# Patient Record
Sex: Female | Born: 1965 | Race: White | Hispanic: No | Marital: Married | Smoking: Never smoker
Health system: Southern US, Community
[De-identification: ages and names within clinical notes are randomized; demographics above are authoritative.]

## PROBLEM LIST (undated history)

## (undated) DIAGNOSIS — R87619 Unspecified abnormal cytological findings in specimens from cervix uteri: Secondary | ICD-10-CM

## (undated) DIAGNOSIS — F32A Depression, unspecified: Secondary | ICD-10-CM

## (undated) DIAGNOSIS — E119 Type 2 diabetes mellitus without complications: Secondary | ICD-10-CM

## (undated) DIAGNOSIS — IMO0002 Reserved for concepts with insufficient information to code with codable children: Secondary | ICD-10-CM

## (undated) DIAGNOSIS — Z87442 Personal history of urinary calculi: Secondary | ICD-10-CM

## (undated) DIAGNOSIS — R1030 Lower abdominal pain, unspecified: Secondary | ICD-10-CM

## (undated) DIAGNOSIS — D219 Benign neoplasm of connective and other soft tissue, unspecified: Secondary | ICD-10-CM

## (undated) DIAGNOSIS — F329 Major depressive disorder, single episode, unspecified: Secondary | ICD-10-CM

## (undated) DIAGNOSIS — F419 Anxiety disorder, unspecified: Secondary | ICD-10-CM

## (undated) DIAGNOSIS — J342 Deviated nasal septum: Secondary | ICD-10-CM

## (undated) DIAGNOSIS — I10 Essential (primary) hypertension: Secondary | ICD-10-CM

## (undated) HISTORY — DX: Anxiety disorder, unspecified: F41.9

## (undated) HISTORY — DX: Major depressive disorder, single episode, unspecified: F32.9

## (undated) HISTORY — DX: Essential (primary) hypertension: I10

## (undated) HISTORY — DX: Benign neoplasm of connective and other soft tissue, unspecified: D21.9

## (undated) HISTORY — DX: Reserved for concepts with insufficient information to code with codable children: IMO0002

## (undated) HISTORY — DX: Unspecified abnormal cytological findings in specimens from cervix uteri: R87.619

## (undated) HISTORY — DX: Lower abdominal pain, unspecified: R10.30

## (undated) HISTORY — DX: Type 2 diabetes mellitus without complications: E11.9

## (undated) HISTORY — DX: Depression, unspecified: F32.A

## (undated) HISTORY — DX: Deviated nasal septum: J34.2

---
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
## 2004-12-08 ENCOUNTER — Encounter: Admission: RE | Admit: 2004-12-08 | Discharge: 2004-12-08 | Payer: Self-pay | Admitting: Internal Medicine

## 2005-04-13 ENCOUNTER — Other Ambulatory Visit: Admission: RE | Admit: 2005-04-13 | Discharge: 2005-04-13 | Payer: Self-pay | Admitting: Obstetrics and Gynecology

## 2007-08-15 ENCOUNTER — Encounter: Admission: RE | Admit: 2007-08-15 | Discharge: 2007-08-15 | Payer: Self-pay | Admitting: Certified Nurse Midwife

## 2008-02-20 ENCOUNTER — Inpatient Hospital Stay (HOSPITAL_COMMUNITY): Admission: AD | Admit: 2008-02-20 | Discharge: 2008-02-20 | Payer: Self-pay | Admitting: Obstetrics and Gynecology

## 2008-02-21 ENCOUNTER — Inpatient Hospital Stay (HOSPITAL_COMMUNITY): Admission: RE | Admit: 2008-02-21 | Discharge: 2008-02-25 | Payer: Self-pay | Admitting: Obstetrics and Gynecology

## 2008-02-29 ENCOUNTER — Inpatient Hospital Stay (HOSPITAL_COMMUNITY): Admission: AD | Admit: 2008-02-29 | Discharge: 2008-02-29 | Payer: Self-pay | Admitting: Obstetrics and Gynecology

## 2009-01-10 ENCOUNTER — Encounter: Admission: RE | Admit: 2009-01-10 | Discharge: 2009-01-10 | Payer: Self-pay | Admitting: Internal Medicine

## 2010-03-06 ENCOUNTER — Encounter: Admission: RE | Admit: 2010-03-06 | Discharge: 2010-03-06 | Payer: Self-pay | Admitting: Internal Medicine

## 2010-07-05 ENCOUNTER — Encounter: Payer: Self-pay | Admitting: Internal Medicine

## 2010-10-27 NOTE — Op Note (Signed)
[name], [name]              ACCOUNT NO.:  [id number]   MEDICAL RECORD NO.:  [id number]          PATIENT TYPE:  INP   LOCATION:  9107                          FACILITY:  [facility]   PHYSICIAN:  Crist Fat. Rivard, M.D. DATE OF BIRTH:  1965-07-20   DATE OF PROCEDURE:  02/21/2008  DATE OF DISCHARGE:                               OPERATIVE REPORT   PREOPERATIVE DIAGNOSES:  1. Intrauterine pregnancy at 38-1/2 weeks.  2. Chronic hypertension.  3. Type 2 diabetes.  4. Breech presentation.   POSTOPERATIVE DIAGNOSES:  1. Intrauterine pregnancy at 38-1/2 weeks.  2. Chronic hypertension.  3. Type 2 diabetes.  4. Breech presentation.  5. Uterine fibroids.   ANESTHESIA:  Spinal, Quillian Quince, MD   PROCEDURE:  Primary low transverse cesarean section.   SURGEON:  Crist Fat. Rivard, MD   ASSISTANT:  Renaldo Reel. Emilee Hero, CNM   ESTIMATED BLOOD LOSS:  500 mL.   PROCEDURE:  After confirming presentation was still breech and after  reviewing the possible risks of this procedure including bleeding,  infection, injury to bowel, bladder, or ureters, informed consent was  obtained.  The patient was taken to OR #1 and given spinal anesthesia  without any complication.  She was placed in the dorsal decubitus  position, pelvis tilted to the left, prepped and draped in a sterile  fashion, and a Foley catheter was inserted in her bladder.   After assessing adequate level of anesthesia, we infiltrated the  suprapubic area with 30 mL of Marcaine 0.25 and performed a Pfannenstiel  incision, which was brought down sharply to the fascia.  The fascia was  incised in a low transverse fashion.  Linea alba was dissected.  Peritoneum was entered sharply.  An Alexis retractor was easily placed.  Visceral peritoneum was then entered in a low transverse fashion  allowing [place] to safely retract bladder by developing a bladder flap.  We  then entered the myometrium in a low transverse fashion, first with  knife then  extended bluntly.  Amniotic fluid was clear.  We assist the  birth of a female infant at 4:22 p.m. in a frank breech presentation.  Mouth and nose were suctioned.  Cord was clamped with 2 Kelly clamps and  sectioned, and the baby was given to Dr. Katrinka Blazing, neonatologist present in  the room.  The placenta was allowed to deliver spontaneously.  At its  complete, the cord had 3 vessels and uterine revision was negative.  Placenta was sent for cord blood donation then to labor and delivery.  We proceeded with closure of the myometrium in 2 layers, first with a  running locked suture of 0 Vicryl then with a Lembert suture of 0 Vicryl  imbricating the first one.  Hemostasis was completed in the right angle  with a figure-of-eight stitch of 0 Vicryl.  Hemostasis was then  completed on peritoneal edges using cauterization.  Both paracolic  gutters were cleaned.  Both tubes and ovaries assessed and normal.  We  noted at this time the presence of 4 fibroids, 2 posterior measuring  approximately 3 cm each and subserosal,  one anterior measuring 5 cm  subserosal, and a 1-cm subserosal lower uterine segment.  We then  irrigated profusely with warm saline and noticed satisfactory  hemostasis.  Under fascia, hemostasis was completed with cautery and the  fascia was closed with 2 running sutures of 1 Vicryl meeting midline.  The incision was irrigated with warm saline.  Hemostasis was completed  with cautery, and a #10 JP drain was left in the incision with a left  counter incision tacked at the skin with 0 silk.  The skin was then  closed with subcuticular suture of 3-0 Monocryl and Steri-Strips.   Instruments and sponge count was complete x2.  Estimated blood loss was  500 mL.  The procedure was very well tolerated by the patient who was  taken to recovery room in a well and stable condition.   SPECIMEN:  Placenta and cord sent to cord blood donation, then labor and  delivery.   Baby boy who was born at  4:22 p.m., received an Apgar of 8 at 1 minute  and 9 at 5 minutes and weighed 6 pounds 2 ounces.      Crist Fat Rivard, M.D.  Electronically Signed     SAR/MEDQ  D:  02/21/2008  T:  02/22/2008  Job:  [id number]

## 2010-10-27 NOTE — H&P (Signed)
[name], [name]              ACCOUNT NO.:  [id number]   MEDICAL RECORD NO.:  [id number]          PATIENT TYPE:  MAT   LOCATION:  MATC                          [facility]ILITY[facility]  [facility]   PHYSICIAN:  Crist Fat. Rivard, M.D. DATE OF BIRTH:  1965-08-26   DATE OF ADMISSION:  02/20/2008  DATE OF DISCHARGE:  02/20/2008                              HISTORY & PHYSICAL   The patient will be admitted on February 21, 2008 for a scheduled  cesarean section.   HISTORY OF PRESENT ILLNESS:  [name] a is a 45 year old gravida 2,  para 0-0-1-0 at 38-1/7 weeks who presents on February 21, 2008 for  scheduled primary cesarean section secondary to breech presentation.  Pregnancy has been remarkable for:  1. Advanced maternal age with amnio decline.  2. Elevated Down syndrome risk of 1 in 147.  3. Type 2 diabetes prior to pregnancy with the patient on insulin      during her pregnancy.  4. Chronic hypertension.  The patient is on Aldomet 500 mg p.o. q.i.d.  5. Breech presentation with desire for Cesarean section.  6. Group B Strep negative.   PRENATAL LABS:  Blood type is A+, Rh antibody negative, VDRL  nonreactive, rubella titer positive, hepatitis B surface antigen  negative, HIV nonreactive, GC and Chlamydia cultures were negative in  February.  Pap was normal in February.  Hemoglobin A1c at her first OB  visit in February was 6.5.  Hemoglobin upon entering the practice was  13, it was within normal limits at 28 weeks, platelet count was 217 at  her first visit.  She declined an amnio.  She did have a first trimester  screen that showed an elevated Down syndrome risk and an elevated AFP  showing an increased risk of open spina bifida.  She had normal GC and  Chlamydia cultures and beta Strep cultures that were negative were  negative done in August.   HISTORY OF PRESENT PREGNANCY:  The patient entered care at approximately  17 weeks.  She declined an amnio.  She did have an elevated Down  syndrome risk of 1 in 147.  She also had an elevated open neural tube  defect risk based on AFP.  Her fasting blood sugar at her first visit  was 119, 2-hour PCs were in the 120s to 130s.  She had been on metformin  prior to pregnancy.  She had been on a sliding scale insulin.  A fetal  echo was scheduled after 18 weeks.  She did have some limitations for  anatomy scan at 18 weeks.  She had a follow-up at 20 weeks showing  normal growth.  Her fasting blood sugars were still  in the 99 to 121  range with 2-hour PCs in the 110 to 167 range. She was offered to  discontinue the metformin and go straight to insulin or to increase her  metformin dose and the patient elected to increase her metformin dose to  500 mg p.o. b.i.d.  She did have a sliding scale; however; she was not  using this.  She was recommended to begin 30  units of NPH and 15 of  regular every morning and 11 units of regular and 11 units of NPH at  bedtime.  Her fetal echocardiogram was normal.  By 22 weeks, her CBGs  were improving on metformin, but she was still taking it t.i.d.  The  recommendation was to take 1500 mg at dinner time.  She was placed on  Aldomet 250 mg p.o. b.i.d. x 22 weeks.  She also had some plantar  fasciitis at that time.  She had to begin insulin at 22 weeks.  By 23  weeks, she was on NPH 14 units at bedtime.  This was began to be  increased to try to get her fasting blood sugar less than or equal to  90.  She had another ultrasound at 24 weeks for growth which was within  normal limits.  Fluid was also normal.  Fasting and 2-hour PCs were  still elevated.  Insulin was elevated to 20 units of NPH at bedtime and  10 units of NPH with breakfast.  By 27 weeks, her NPH had to be  increased to 30 units at bedtime and 20 units in the morning and a  sliding scale was also recommended.  At 28 weeks, she had an ultrasound  showing normal growth at the 75th percentile and normal fluid.  Aldomet  was increased to 5  mg p.o. t.i.d. and her insulins were increased at  bedtime and in the morning. By 30 weeks, she was on 24 units of NPH in  the morning.  NSTs were begun twice weekly.  She had an ultrasound at 31  weeks showing growth at the 3 pound 13 ounce range, normal fluid, BPP  was 8/8, fetus was breech at that time.  Blood pressures were the  140s/90s.  NPH was increased to 26 units in the morning and 36 units at  bedtime.  She at that time was encouraged to more compliant with meals  and her blood pressure medication.  At 33 weeks, insulin was increased  to 40 units at bedtime and 28 units in the morning but to be given at  10:00 a.m. instead of at 8:00 a.m.  At 34 weeks, she still remained  breech.  Again, at 35 weeks, position was reevaluated.  Normal fluid was  note, but the fetus was still breech with an estimated fetal weight of 5  pounds 10 ounces.  Options for management were reviewed by Dr. Pennie Rushing  including observation for spontaneous version, external version, or  scheduled Cesarean section. Subsequent to this visit, the patient did  wish to proceed with scheduling Cesarean.  Her pressures were in the  140s to 150s/90s.  BPP was normal at 36 weeks.  She had a follow-up  ultrasound at 36 weeks with normal BPP and still breech presentation.  By 37 weeks, she had specifically defined she desired a Cesarean  section.  Her growth was at 6 pounds 13 ounces, average for gestational  age.  AFI was normal.  BPP was normal.  There was a bilateral hydrocele  noted.  Her blood pressure was 130/100.  She was still on Aldomet 500  t.i.d.  She was on 28 units of NPH in the morning, 40 units of NPH at  bedtime and she occasionally had use of a regular insulin sliding scale.  Aldomet was increased to q.i.d.  She was also placed on Ambien.  On  February 20, 2008, she was seen in the office for NST. NST was  reactive;  blood pressures, however, was 142/108 and 150/104.  She had taken 1000  mg of Aldomet in  the morning because her blood pressure was elevated.  She then gave a report of her blood pressures being generally in the  150s/100 all weekend.  She denied any other PIH symptoms.  She was sent  to Maternity Admissions Unit for a PIH workup and these findings were  within normal limits.  The patient was offered to have her Cesarean  section performed today or on February 21, 2008 as scheduled and the  patient did elect to wait and proceed with it on February 21, 2008.  PIH  precautions were given to the patient.   OBSTETRICAL HISTORY:  In 1993 she had a termination of pregnancy in the  first trimester.   MEDICAL HISTORY:  She was on birth control pills more than 10 years ago.  She had an abnormal Pap in the past with a colposcopy.  She has had some  fibroids noted on ultrasound prior to pregnancy.  She reports the usual  childhood illnesses.  She is hypertensive on medication, even prior to  pregnancy.  She is a type 2 diabetic on oral medications prior to  pregnancy and had very occasional insulin use prior to pregnancy.   SURGICAL HISTORY:  Includes a deviated septum in 1984.   ALLERGIES:  Te patient has no known medication allergies.   FAMILY HISTORY:  Her maternal grandmother had angina.  Her father had  atrial fibrillation.  Her father and maternal grandmother have  hypertension.  Her paternal aunt has varicosities.  Her father has had a  stroke.  Her paternal grandmother had some type of uterine or ovarian  cancer.  Her maternal grandfather had colon cancer.  The paternal  grandfather committed suicide and had other mental issues.  Her father  is bipolar.  Her mother has depression and anxiety.   GENETIC HISTORY:  Remarkable for the patient's age of 46.  Her brother  also has scoliosis.  The patient also notes that mental health issues  run on both sides of the family.   SOCIAL HISTORY:  The patient is married to the father of baby.  He is  involved and supportive.  His  name is Lula Olszewski.  The patient is  Caucasian.  She denies any religious affiliation.  She is graduate  educated.  She is in advertising.  Her husband has 3 years of college.  She is currently unemployed.  She has been followed by the Physician  [facility].  She denies any alcohol, drug or tobacco  use during this pregnancy.   PHYSICAL EXAM:  Blood pressure today was 142/108 and 150/104 in the  office, it was less in Maternity admissions.  Other vital signs are  stable.  HEENT: Within normal limits.  LUNGS:  Her breath sounds are clear.  HEART:  Regular rate and rhythm without murmur.  BREASTS:  Soft and nontender.  ABDOMEN:  Fundal height is approximately 39 to 40 cm.  Estimated fetal  weight is 8 to 8-1/2 pounds.  Uterine contractions currently are none  per patient report.  PELVIC:  Exam is deferred.  EXTREMITIES:  Deep tendon reflexes are 2+ without clonus.  There is 1+  edema noted in the lower extremities.   The patient states her blood sugars have been within normal limits.  She  is currently on Aldomet 5 mg p.o. t.i.d., NPH insulin 20 units in the  morning  and 40 units at bedtime with an occasional Humulin regular  sliding scale.  Fetal heart rate today was reactive on NST.   IMPRESSION:  1. Intrauterine pregnancy at 88 and 4-1/7 weeks.  2. Breech presentation with desire for Cesarean section.  3. Advanced maternal age with amniocentesis declined but elevated risk      of Down syndrome and open spina bifida on first trimester screen      and alpha-fetoprotein.  4. Type 2 diabetes with the patient on insulin during pregnancy.  5. Chronic hypertension.  The patient is on Aldomet 5 mg p.o. q.i.d.  6. Group B Streptococcus negative.   PLAN:  1. Admit to [facility] per consult with Dr. Silverio Lay as attending physician.  2. Routine physician preoperative orders.  3. Dr. Estanislado Pandy will determine plan for monitoring of blood sugars  and      treatment of blood pressure postsurgery.      Renaldo [name], C.N.M.      Crist Fat Rivard, M.D.  Electronically Signed    VLL/MEDQ  D:  02/20/2008  T:  02/20/2008  Job:  [id number]

## 2010-10-27 NOTE — Discharge Summary (Signed)
[name]              ACCOUNT NO.:  [id number]   MEDICAL RECORD NO.:  [id number]          PATIENT TYPE:  INP   LOCATION:  9107                          [facility]ACILITY:  [facility]   PHYSICIAN:  Crist Fat. Rivard, M.D. DATE OF BIRTH:  [birth year]   DATE OF ADMISSION:  02/21/2008  DATE OF DISCHARGE:  02/25/2008                               DISCHARGE SUMMARY   ADMITTING DIAGNOSES:  1. Intrauterine pregnancy at 38-3/7th weeks.  2. Chronic hypertension.  3. Type 2 diabetes.  4. Breech presentation.   DISCHARGE DIAGNOSES:  1. Intrauterine pregnancy at 38-3/7th weeks.  2. Chronic hypertension.  3. Type 2 diabetes.  4. Breech presentation.  5. Uterine fibroids.   PROCEDURES:  Primary low transverse cesarean section.   HOSPITAL COURSE:  [name] is a 45 year old gravida 2, para 0-0-1-0  at 38-1/7th weeks, who presented on February 21, 2008 for scheduled  primary cesarean section secondary to breech presentation.  Pregnancy  has been remarkable for:  1. Advanced maternal age with amnio declined,elevated Down syndrome      risk of 1: 147.  2. Type 2 diabetes prior to pregnancy with the patient on insulin      during her pregnancy.  3. Chronic hypertension with the patient on Aldomet 500 mg p.o. q.i.d.  4. Breech presentation with desire for cesarean section.  5. Group B strep negative.   The patient was taken to the operating room by Dr. Dois Davenport Rivard where a  primary low transverse cesarean section was performed with delivery of a  viable female weight 6 pounds 2 ounces.  Apgars were 8 and 9.  The patient  tolerated the procedure well and was taken to recovery in good  condition.  Infant was taken to the full-term nursery.  There were 4  subserosal fibroids noted between 1 and 5 cm.  The patient tolerated the  procedure well.  Infant was taken to the full-term nursery.  By postop  day #1, the patient was doing well.  She was up ad lib.  She was breast  feeding.  Her fasting blood  sugar was 68.  She had had a max 2-hour  postprandial 176 at 9:30 p.m.  Hemoglobin on day one was 11.6, white  blood cell count was 10.7, and platelet count was 146, and blood  pressure was 112/72.  Other vital signs were stable.  Her incision was  clean, dry and intact.  She was begun on metformin 500 mg p.o. b.i.d.  Fasting blood sugars were checked.  Blood sugars were checked for  fasting and 2-hour p.c.  Aldomet 500 mg p.o. q.i.d. was continued.  The  rest of hospital stay, the patient did have some elevations of her blood  pressure.  Her blood sugars were within normal limits. She required some  additional p.r.n. labetalol IV and on February 24, 2008, she was placed  on labetalol 400 mg p.o. t.i.d.  She had previously been on labetalol  300 mg p.o. t.i.d. and then at midnight at 8:00 a.m. and then on  February 24, 2008 she has increased the labetalol 400 mg p.o.  at 4:00  p.m., 12 midnight and 8 a.m.  Her physical exam continued to be within  normal limits.  Her blood sugars remained at good control.  By postop  day #4, she was having no headache, visual symptoms, or epigastric pain.  Her blood pressure was 149/86.  She had received 2 doses of IV labetalol  through the night, early morning 9:13 with blood pressures of 168/110  and 173/102.  After that blood pressures were in the 140-150s/93-95.  Other vital signs were stable.  Fasting blood sugar was 84.  Her  physical exam was within normal limits.  Her JP drain was draining a  very small amount of serosanguineous fluid.  Dr. Estanislado Pandy was consulted.  The patient was deemed to receive full benefit of hospital stay and was  discharged home.  JP drain was removed.   DISCHARGE INSTRUCTIONS:  Per [facility] handout.  PIH and  preeclampsia precautions were also reviewed with the patient.   DISCHARGE MEDICATIONS:  1. Labetalol 600 mg p.o. t.i.d.  2. Metformin 500 mg p.o. b.i.d.  3. Motrin 600 mg one p.o. q.6 h. p.r.n. pain.  4.  Percocet 5/325 one to two p.o. q. 2-4 h. p.r.n. pain.   DISCHARGE FOLLOWUP:  Will occur by the [facility] nurse to see the  patient on Tuesday, February 27, 2008.  The patient was then followed  up in 6 weeks [facility] or p.r.n. based upon blood pressure  values.      [name] [name], C.N.M.      Crist Fat Rivard, M.D.  Electronically Signed    VLL/MEDQ  D:  02/25/2008  T:  02/26/2008  Job:  [id number]

## 2011-03-15 LAB — URINE MICROSCOPIC-ADD ON

## 2011-03-15 LAB — URINALYSIS, ROUTINE W REFLEX MICROSCOPIC
Bilirubin Urine: NEGATIVE
Ketones, ur: NEGATIVE
Leukocytes, UA: NEGATIVE
Nitrite: NEGATIVE
Protein, ur: NEGATIVE
Urobilinogen, UA: 0.2

## 2011-03-15 LAB — COMPREHENSIVE METABOLIC PANEL
BUN: 9
CO2: 25
Calcium: 8.6
Chloride: 106
Creatinine, Ser: 0.71
GFR calc Af Amer: >60
Total Protein: 6.2

## 2011-03-15 LAB — CBC
Hemoglobin: 11.9 — ABNORMAL LOW
Platelets: 320
WBC: 7.6

## 2011-03-17 LAB — GLUCOSE, CAPILLARY
Glucose-Capillary: 121 — ABNORMAL HIGH
Glucose-Capillary: 128 — ABNORMAL HIGH
Glucose-Capillary: 134 — ABNORMAL HIGH
Glucose-Capillary: 176 — ABNORMAL HIGH
Glucose-Capillary: 54 — ABNORMAL LOW
Glucose-Capillary: 67 — ABNORMAL LOW
Glucose-Capillary: 68 — ABNORMAL LOW
Glucose-Capillary: 69 — ABNORMAL LOW
Glucose-Capillary: 79
Glucose-Capillary: 80
Glucose-Capillary: 81
Glucose-Capillary: 84
Glucose-Capillary: 98

## 2011-03-17 LAB — COMPREHENSIVE METABOLIC PANEL
Alkaline Phosphatase: 96
Chloride: 105
Creatinine, Ser: 0.58
GFR calc Af Amer: >60
Glucose, Bld: 129 — ABNORMAL HIGH
Total Bilirubin: 0.2 — ABNORMAL LOW
Total Protein: 5.5 — ABNORMAL LOW

## 2011-03-17 LAB — URINALYSIS, ROUTINE W REFLEX MICROSCOPIC
Bilirubin Urine: NEGATIVE
Glucose, UA: NEGATIVE
Hgb urine dipstick: NEGATIVE
Ketones, ur: NEGATIVE
Specific Gravity, Urine: 1.025
Urobilinogen, UA: 0.2

## 2011-03-17 LAB — RPR: RPR Ser Ql: NONREACTIVE

## 2011-03-17 LAB — CBC
HCT: 34.1 — ABNORMAL LOW
HCT: 37.5
Hemoglobin: 12.8
MCV: 92.7
Platelets: 145 — ABNORMAL LOW
Platelets: 146 — ABNORMAL LOW
RDW: 13.8
WBC: 10.7 — ABNORMAL HIGH
WBC: 8.6

## 2011-03-17 LAB — BASIC METABOLIC PANEL
CO2: 20
Calcium: 8.5
Creatinine, Ser: 0.61
GFR calc Af Amer: >60
Potassium: 4.2
Sodium: 132 — ABNORMAL LOW

## 2011-05-25 ENCOUNTER — Other Ambulatory Visit: Payer: Self-pay | Admitting: Internal Medicine

## 2011-05-25 DIAGNOSIS — Z1231 Encounter for screening mammogram for malignant neoplasm of breast: Secondary | ICD-10-CM

## 2011-05-28 ENCOUNTER — Ambulatory Visit
Admission: RE | Admit: 2011-05-28 | Discharge: 2011-05-28 | Disposition: A | Discharge status: Home / Self Care | Payer: BC Managed Care – PPO | Source: Ambulatory Visit | Attending: Internal Medicine | Admitting: Internal Medicine

## 2011-05-28 DIAGNOSIS — Z1231 Encounter for screening mammogram for malignant neoplasm of breast: Secondary | ICD-10-CM

## 2012-01-12 ENCOUNTER — Ambulatory Visit: Payer: Self-pay | Admitting: Obstetrics and Gynecology

## 2012-01-14 ENCOUNTER — Telehealth: Payer: Self-pay

## 2012-01-14 DIAGNOSIS — E119 Type 2 diabetes mellitus without complications: Secondary | ICD-10-CM | POA: Insufficient documentation

## 2012-01-14 DIAGNOSIS — I10 Essential (primary) hypertension: Secondary | ICD-10-CM | POA: Insufficient documentation

## 2012-01-14 DIAGNOSIS — F419 Anxiety disorder, unspecified: Secondary | ICD-10-CM | POA: Insufficient documentation

## 2012-01-14 DIAGNOSIS — IMO0002 Reserved for concepts with insufficient information to code with codable children: Secondary | ICD-10-CM | POA: Insufficient documentation

## 2012-01-14 DIAGNOSIS — R1030 Lower abdominal pain, unspecified: Secondary | ICD-10-CM | POA: Insufficient documentation

## 2012-01-14 DIAGNOSIS — J342 Deviated nasal septum: Secondary | ICD-10-CM | POA: Insufficient documentation

## 2012-01-14 DIAGNOSIS — D219 Benign neoplasm of connective and other soft tissue, unspecified: Secondary | ICD-10-CM | POA: Insufficient documentation

## 2012-01-14 NOTE — Telephone Encounter (Signed)
Encounter created in error

## 2012-01-20 ENCOUNTER — Ambulatory Visit: Payer: BC Managed Care – PPO | Admitting: Obstetrics and Gynecology

## 2012-02-09 ENCOUNTER — Ambulatory Visit (INDEPENDENT_AMBULATORY_CARE_PROVIDER_SITE_OTHER): Payer: BC Managed Care – PPO | Admitting: Obstetrics and Gynecology

## 2012-02-09 ENCOUNTER — Encounter: Payer: Self-pay | Admitting: Obstetrics and Gynecology

## 2012-02-09 VITALS — BP 104/64 | Ht 60.0 in | Wt 185.0 lb

## 2012-02-09 DIAGNOSIS — R109 Unspecified abdominal pain: Secondary | ICD-10-CM

## 2012-02-09 DIAGNOSIS — Z124 Encounter for screening for malignant neoplasm of cervix: Secondary | ICD-10-CM

## 2012-02-09 NOTE — Progress Notes (Signed)
Subjective:  AEX:  Last Pap: 12/30/2009 WNL: ASCUS Regular Periods:yes Contraception: None Husband with Drema Balzarine' Monthly Breast exam:yes Tetanus<44yrs: unsure Nl.Bladder Function:yes Daily BMs:yes Healthy Diet:yes Calcium:no Mammogram:yes Date of Mammogram: 05/2011 Exercise:yes Have often Exercise: 3-4 times weekly Seatbelt: yes Abuse at home: no Stressful work:no Sigmoid-colonoscopy: pt states she had one in college Bone Density: No PCP: Dr. Ludwig Clarks Change in PMH: None Change in [facility]: None    [name] is a 46 y.o. female, G2P0011, who presents for an annual exam.     History   Social History  . Marital Status: Married    Spouse Name: N/A    Number of Children: N/A  . Years of Education: N/A   Social History Main Topics  . Smoking status: Never Smoker   . Smokeless tobacco: Never Used  . Alcohol Use: No  . Drug Use: No  . Sexually Active: Not Currently    Birth Control/ Protection: None   Other Topics Concern  . None   Social History Narrative  . None    Menstrual cycle:   LMP: Patient's last menstrual period was 01/30/2012.           Cycle: monthly for 5-6 days, heavy for 2 days.  Minor cramps, no meds required.  The following portions of the patient's history were reviewed and updated as appropriate: allergies, current medications, past family history, past medical history, past social history, past surgical history and problem list.  Review of Systems Pertinent items are noted in HPI. Breast:Negative for breast lump,nipple discharge or nipple retraction Gastrointestinal:  abdominal pain on left side with examination.  Her  bowel habits consists of chronic diarrhea, somewhat improved with less alcohol intake.  No rectal bleeding Urinary:negative   Objective:    BP 104/64  Ht 5' (1.524 m)  Wt 185 lb (83.915 kg)  BMI 36.13 kg/m2  LMP 01/30/2012    Weight:  Wt Readings from Last 1 Encounters:  02/09/12 185 lb (83.915 kg)   BMI: Body mass index is 36.13 kg/(m^2).  General Appearance: Alert, appropriate appearance for age. No acute distress HEENT: Grossly normal Neck / Thyroid: Supple, no masses, nodes or enlargement Lungs: clear to auscultation bilaterally Back: No CVA tenderness Breast Exam: No masses or nodes.No dimpling, nipple retraction or discharge. Cardiovascular: Regular rate and rhythm. S1, S2, no murmur Gastrointestinal: Soft, non-tender, no masses or organomegaly Pelvic Exam: Vulva and vagina appear normal. Bimanual exam reveals normal uterus and adnexa. Exam limited by anxiety and body habitus Rectovaginal: normal rectal, no masses Lymphatic Exam: Non-palpable nodes in neck, clavicular, axillary, or inguinal regions Skin: no rash or abnormalities Neurologic: Normal gait and speech, no tremor  Psychiatric: Alert and oriented, appropriate affect.   Wet Prep:not applicable Urinalysis:not applicable UPT: Not done   Assessment:    LLQ pain  chronicdiarrhea Family hx of "female cancer"   Plan:    Mammogram due 05/2012 pap smear with HR HPV.  No hx of abnl pap U/S.  If no etiology found for pain, will do abd pelvic CT  With contrast STD screening: declined Contraception:no method.  declined      Dierdre Forth, MD

## 2012-02-14 LAB — PAP IG AND HPV HIGH-RISK

## 2012-02-29 ENCOUNTER — Telehealth: Payer: Self-pay | Admitting: Obstetrics and Gynecology

## 2012-02-29 NOTE — Telephone Encounter (Signed)
TRIAGE/ TST RES °

## 2012-02-29 NOTE — Telephone Encounter (Signed)
Lm on vm to cb per telephone call.  

## 2012-02-29 NOTE — Telephone Encounter (Signed)
Tc from pt per telephone call. Told pt pap smear-wnl and HPV testing-neg. Pt voices understanding.

## 2012-03-14 ENCOUNTER — Other Ambulatory Visit: Payer: BC Managed Care – PPO

## 2012-03-14 ENCOUNTER — Encounter: Payer: BC Managed Care – PPO | Admitting: Obstetrics and Gynecology

## 2012-03-15 ENCOUNTER — Other Ambulatory Visit: Payer: Self-pay | Admitting: Obstetrics and Gynecology

## 2012-03-15 ENCOUNTER — Encounter: Payer: Self-pay | Admitting: Internal Medicine

## 2012-03-15 ENCOUNTER — Ambulatory Visit (INDEPENDENT_AMBULATORY_CARE_PROVIDER_SITE_OTHER): Payer: Medicaid Other

## 2012-03-15 ENCOUNTER — Ambulatory Visit (INDEPENDENT_AMBULATORY_CARE_PROVIDER_SITE_OTHER): Payer: Medicaid Other | Admitting: Obstetrics and Gynecology

## 2012-03-15 ENCOUNTER — Encounter: Payer: Self-pay | Admitting: Obstetrics and Gynecology

## 2012-03-15 VITALS — BP 110/72 | Ht 59.0 in | Wt 184.0 lb

## 2012-03-15 DIAGNOSIS — R109 Unspecified abdominal pain: Secondary | ICD-10-CM

## 2012-03-15 DIAGNOSIS — G8929 Other chronic pain: Secondary | ICD-10-CM

## 2012-03-15 DIAGNOSIS — R1032 Left lower quadrant pain: Secondary | ICD-10-CM

## 2012-03-15 NOTE — Progress Notes (Signed)
F/U LLQ PAIN  Subjective: Pt states that pain is only present during an exam.  She has a long history of functional bowel changes with loose BMs.  She has had a colonoscopy about 20 years ago for rectal bleeding.  Has grandfather with colon cancer.  Objective: BP 110/72  Ht 4\' 11"  (1.499 m)  Wt 184 lb (83.462 kg)  BMI 37.16 kg/m2  LMP 02/25/2012  ULTRASOUND: Uterus: Length: 7.23 cm   Width:  5.22 cm   Height:  4.23 cm  ROV:  Length: 2.74 cm   Width: 1.66 cm   Height: 2.08 cm  LOV:  Length: 2.26 cm   Width: 1.65 cm   Height: 1.25 cm  Endometrium: 1.08 cm   Left ovary:Normal Right ovary:Normal Fibroids:no   CDS fluid:no  Comment: transvaginal and transabdominal images. Urinary bladder - unremarkable. Anteverted uterus. c-section scar is noted.  No uterine masses. Endo thickness is WNL for secretory phase. LMP was 02/25/2012 Both ovaries are WNLs. Patient is tender in LLQ when scanning left adnexa. No adnexal abnormality is seen. No free fluid.   Assessment: No gyn etiology of LLQ pain noted  Recommendation: GI referral  F/u here for aex or prn

## 2012-04-17 ENCOUNTER — Ambulatory Visit: Payer: BC Managed Care – PPO | Admitting: Internal Medicine

## 2012-05-05 ENCOUNTER — Encounter: Payer: Self-pay | Admitting: Internal Medicine

## 2012-05-05 ENCOUNTER — Ambulatory Visit (INDEPENDENT_AMBULATORY_CARE_PROVIDER_SITE_OTHER): Payer: Medicaid Other | Admitting: Internal Medicine

## 2012-05-05 VITALS — BP 112/68 | HR 70 | Ht 60.0 in | Wt 184.0 lb

## 2012-05-05 DIAGNOSIS — R1032 Left lower quadrant pain: Secondary | ICD-10-CM

## 2012-05-05 DIAGNOSIS — R197 Diarrhea, unspecified: Secondary | ICD-10-CM

## 2012-05-05 MED ORDER — MOVIPREP 100 G PO SOLR: ORAL | Status: DC

## 2012-05-05 NOTE — Patient Instructions (Addendum)
You have been scheduled for a colonoscopy with propofol. Please follow written instructions given to you at your visit today.  Please use the free moviprep we have given you today If you use inhalers (even only as needed) or a CPAP machine, please bring them with you on the day of your procedure.   Thank you for choosing me and [facility].  Iva Boop, M.D., [facility]

## 2012-05-05 NOTE — Progress Notes (Signed)
[facility] Subjective:    Patient ID: [name], female    DOB: Nov 15, 1965, 46 y.o.   MRN: [id number]  HPI Pleasant middle-aged woman here because of LLQ pain and loose stools to diarrhea. Relatively chronic issues which seem a bit worse. Significant stressor is that husband suffered axonal neuropathy after a flu vaccine and is disabled - they are waiting for a settlement sum to help financially. However, even before that has had LLQ discomfort and chronic loose stools. Does eat fiber bar every AM and possibly has high fiber diet. Is on metformin but sxs predate. Rare heartburn. GI ROS otherwise negative. No bleeding. Has seen Dr. Pennie Rushing - GYN evaluation negative. Medications, allergies, past medical history, past surgical history, family history and social history are reviewed and updated in the EMR.  Review of Systems + deprssive sxs, fatigue, rash on hands, stress urinary incontinence. All other ROS negative or as per HPI    Objective:   Physical Exam General:  Well-developed, well-nourished and in no acute distress Eyes:  anicteric. ENT:   Mouth and posterior pharynx free of lesions.  Neck:   supple w/o thyromegaly or mass.  Lungs: Clear to auscultation bilaterally. Heart:  S1S2, no rubs, murmurs, gallops. Abdomen:  soft, non-tender, no hepatosplenomegaly, hernia, or mass and BS+.  Rectal: Deferred until colonoscopy Lymph:  no cervical or supraclavicular adenopathy. Extremities:   no edema Skin   no rash. Neuro:  A&O x 3.  Psych:  appropriate mood and  Affect.   Data Reviewed: Pelvic US - ok OB GYN notes   Assessment & Plan:   1. Diarrhea    2. LLQ pain     Could all be IBS but ? IBD. She is 46 and having these problems so colonoscopy reasonable.  1. Schedule colonoscopy with possible random bxs and terminal ileum intubation The risks and benefits as well as alternatives of endoscopic procedure(s) have been discussed and reviewed. All questions  answered. The patient agrees to proceed. Further plans pending #1. A negative exam may be enough here, i.e. eough reassurance.   Cc: Dierdre Forth, MD

## 2012-05-25 ENCOUNTER — Telehealth: Payer: Self-pay | Admitting: Obstetrics and Gynecology

## 2012-05-25 ENCOUNTER — Telehealth: Payer: Self-pay | Admitting: Internal Medicine

## 2012-05-25 NOTE — Telephone Encounter (Signed)
[facility], pt's last mammogram 05/2011 showed fibroglandular densities in breast. Would you rec a regular screening mammo or 3D mammo?

## 2012-05-25 NOTE — Telephone Encounter (Signed)
Either is fine.  It is pt choice.  She does not have dense breasts

## 2012-05-25 NOTE — Telephone Encounter (Signed)
Patient needs a Feb appt.  She will call back once after the first of the year, as that schedule is not out yet.

## 2012-05-25 NOTE — Telephone Encounter (Signed)
Forwarded to CW 

## 2012-05-26 NOTE — Telephone Encounter (Signed)
Lm on vm to cb per telephone call.  

## 2012-05-26 NOTE — Telephone Encounter (Signed)
Tc from pt per [facility] recs. Pt voices understanding.

## 2012-06-20 ENCOUNTER — Encounter: Payer: Medicaid Other | Admitting: Internal Medicine

## 2012-07-07 ENCOUNTER — Other Ambulatory Visit: Payer: Self-pay | Admitting: Obstetrics and Gynecology

## 2012-07-07 DIAGNOSIS — Z1231 Encounter for screening mammogram for malignant neoplasm of breast: Secondary | ICD-10-CM

## 2012-08-03 ENCOUNTER — Ambulatory Visit
Admission: RE | Admit: 2012-08-03 | Discharge: 2012-08-03 | Disposition: A | Discharge status: Home / Self Care | Payer: Medicaid Other | Source: Ambulatory Visit | Attending: Obstetrics and Gynecology | Admitting: Obstetrics and Gynecology

## 2012-08-03 DIAGNOSIS — Z1231 Encounter for screening mammogram for malignant neoplasm of breast: Secondary | ICD-10-CM

## 2013-02-05 ENCOUNTER — Other Ambulatory Visit: Payer: Self-pay | Admitting: *Deleted

## 2013-02-05 NOTE — Telephone Encounter (Signed)
rx request for test strips, denied, patient needs ov

## 2013-08-06 ENCOUNTER — Other Ambulatory Visit: Payer: Self-pay

## 2013-08-06 ENCOUNTER — Other Ambulatory Visit: Payer: Self-pay | Admitting: Internal Medicine

## 2013-08-06 DIAGNOSIS — Z1231 Encounter for screening mammogram for malignant neoplasm of breast: Secondary | ICD-10-CM

## 2013-08-28 ENCOUNTER — Ambulatory Visit
Admission: RE | Admit: 2013-08-28 | Discharge: 2013-08-28 | Disposition: A | Discharge status: Home / Self Care | Payer: Medicaid Other | Source: Ambulatory Visit | Attending: Internal Medicine | Admitting: Internal Medicine

## 2013-08-28 DIAGNOSIS — Z1231 Encounter for screening mammogram for malignant neoplasm of breast: Secondary | ICD-10-CM

## 2014-03-25 ENCOUNTER — Other Ambulatory Visit (HOSPITAL_COMMUNITY): Payer: BC Managed Care – PPO | Attending: Psychiatry | Admitting: Psychiatry

## 2014-03-25 ENCOUNTER — Encounter (HOSPITAL_COMMUNITY): Payer: Self-pay

## 2014-03-25 DIAGNOSIS — F331 Major depressive disorder, recurrent, moderate: Secondary | ICD-10-CM

## 2014-03-25 DIAGNOSIS — E119 Type 2 diabetes mellitus without complications: Secondary | ICD-10-CM | POA: Diagnosis not present

## 2014-03-25 DIAGNOSIS — F419 Anxiety disorder, unspecified: Secondary | ICD-10-CM | POA: Diagnosis present

## 2014-03-25 DIAGNOSIS — I10 Essential (primary) hypertension: Secondary | ICD-10-CM | POA: Insufficient documentation

## 2014-03-25 DIAGNOSIS — G4700 Insomnia, unspecified: Secondary | ICD-10-CM | POA: Insufficient documentation

## 2014-03-25 DIAGNOSIS — F4310 Post-traumatic stress disorder, unspecified: Secondary | ICD-10-CM
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
## 2014-03-25 NOTE — Progress Notes (Signed)
    Daily Group Progress Note  Program: IOP  Group Time: 9:00-10:30  Participation Level: Active  Behavioral Response: Appropriate  Type of Therapy:  Group Therapy  Summary of Progress: Pt. Arrived at the end of the group. Met with case manager and psychiatrist.      Group Time: 10:30-12:00  Participation Level:  Active  Behavioral Response: Appropriate  Type of Therapy: Psycho-education Group  Summary of Progress: Pt. Participated in grief and loss group facilitated by Jeanella Craze.  Nancie Neas, COUNS

## 2014-03-26 ENCOUNTER — Other Ambulatory Visit (HOSPITAL_COMMUNITY): Payer: BC Managed Care – PPO | Admitting: Psychiatry

## 2014-03-26 DIAGNOSIS — F331 Major depressive disorder, recurrent, moderate: Secondary | ICD-10-CM

## 2014-03-26 DIAGNOSIS — F4310 Post-traumatic stress disorder, unspecified: Secondary | ICD-10-CM

## 2014-03-26 DIAGNOSIS — F419 Anxiety disorder, unspecified: Secondary | ICD-10-CM | POA: Diagnosis not present

## 2014-03-27 ENCOUNTER — Other Ambulatory Visit (HOSPITAL_COMMUNITY): Payer: BC Managed Care – PPO | Admitting: Psychiatry

## 2014-03-27 DIAGNOSIS — F331 Major depressive disorder, recurrent, moderate: Secondary | ICD-10-CM

## 2014-03-27 DIAGNOSIS — F419 Anxiety disorder, unspecified: Secondary | ICD-10-CM | POA: Diagnosis not present

## 2014-03-27 DIAGNOSIS — F4310 Post-traumatic stress disorder, unspecified: Secondary | ICD-10-CM

## 2014-03-27 NOTE — Progress Notes (Signed)
    Daily Group Progress Note  Program: IOP  Group Time: 9:00-10:30  Participation Level: Active  Behavioral Response: Appropriate  Type of Therapy:  Group Therapy  Summary of Progress: Pt. Reports that she is doing "ok". Pt. Processing childhood history, excessive caregiving of her parents, desire to develop healthier boundaries in family relationships.      Group Time: 10:30-12:00  Participation Level:  Active  Behavioral Response: Appropriate  Type of Therapy: Group Therapy  Summary of Progress: Pt. Participated in discussion about developing healthy relationship boundaries.   Nancie Neas, COUNS

## 2014-03-28 ENCOUNTER — Other Ambulatory Visit (INDEPENDENT_AMBULATORY_CARE_PROVIDER_SITE_OTHER): Payer: BC Managed Care – PPO | Admitting: Psychiatry

## 2014-03-28 DIAGNOSIS — F4310 Post-traumatic stress disorder, unspecified: Secondary | ICD-10-CM

## 2014-03-28 MED ORDER — ESCITALOPRAM OXALATE 20 MG PO TABS: 20.0000 mg | ORAL_TABLET | Freq: Every day | ORAL | Status: DC

## 2014-03-28 NOTE — Progress Notes (Signed)
    Daily Group Progress Note  Program: IOP  Group Time: 9:00-10:30  Participation Level: Active  Behavioral Response: Appropriate  Type of Therapy:  Group Therapy  Summary of Progress: Pt. Reported that she was doing "ok". Pt. Continuing to work on personal boundaries, fear that she oversteps in social situations. Pt. Discussed history of medicating with a bottle of wine every night.     Group Time: 10:30-12:00  Participation Level:  Active  Behavioral Response: Appropriate  Type of Therapy: Psycho-education Group  Summary of Progress: Pt. Participated in group facilitated by the mental health association.   Nancie Neas, COUNS

## 2014-03-28 NOTE — Progress Notes (Signed)
Patient ID: [name], female   DOB: 1965-12-06, 48 y.o.   MRN: [id number] Patient seen with Dellia Nims, states she suffered from depression since 2006 and has been very busy taking care of the husband working full-time and taking care of the child. Patient continues to have trouble with middle insomnia although the Xanax helps her mood is depressed anxious angry irritable hopeless and helpless. Patient continues to be angry at her parents because she is the caretaker. Self medicates with 5 glasses of wine every day. I discussed the rationale risks benefits options off Lexapro 20 mg every day for her depression and she gave me her informed consent she'll start that today. She'll continue her other medications at the present doses. Discussed discontinuing her alcohol and patient is willing to do. Denies suicidal or homicidal ideation no hallucinations or delusions.

## 2014-03-28 NOTE — Progress Notes (Signed)
    Daily Group Progress Note  Program: IOP  Group Time: 9:00-10:30  Participation Level: Active  Behavioral Response: Appropriate  Type of Therapy:  Group Therapy  Summary of Progress: Pt. Continuing to work on developing healthy boundaries in her family relationships. Pt. Requested feedback from group about developing balance and not appearing overbearing in interpersonal relationships.      Group Time: 10:30-12:00  Participation Level:  Active  Behavioral Response: Appropriate  Type of Therapy: Psycho-education Group  Summary of Progress: Pt. Participated in discussion about developing healthy self-care routine.   Nancie Neas, COUNS

## 2014-03-29 ENCOUNTER — Ambulatory Visit (INDEPENDENT_AMBULATORY_CARE_PROVIDER_SITE_OTHER): Payer: BC Managed Care – PPO | Admitting: Cardiology

## 2014-03-29 ENCOUNTER — Other Ambulatory Visit (HOSPITAL_COMMUNITY): Payer: BC Managed Care – PPO | Admitting: Psychiatry

## 2014-03-29 ENCOUNTER — Encounter: Payer: Self-pay | Admitting: Cardiology

## 2014-03-29 VITALS — BP 120/78 | HR 60 | Ht 60.0 in | Wt 181.8 lb

## 2014-03-29 DIAGNOSIS — F4310 Post-traumatic stress disorder, unspecified: Secondary | ICD-10-CM

## 2014-03-29 DIAGNOSIS — F331 Major depressive disorder, recurrent, moderate: Secondary | ICD-10-CM

## 2014-03-29 DIAGNOSIS — R079 Chest pain, unspecified: Secondary | ICD-10-CM

## 2014-03-29 DIAGNOSIS — F419 Anxiety disorder, unspecified: Secondary | ICD-10-CM | POA: Diagnosis not present

## 2014-03-29 DIAGNOSIS — E119 Type 2 diabetes mellitus without complications: Secondary | ICD-10-CM

## 2014-03-29 DIAGNOSIS — I10 Essential (primary) hypertension: Secondary | ICD-10-CM

## 2014-03-29 NOTE — Patient Instructions (Signed)
Your physician has requested that you have an exercise tolerance test. For further information please visit [email or web address]. Please also follow instruction sheet, as given.  Your physician recommends that you continue on your current medications as directed. Please refer to the Current Medication list given to you today. Your physician recommends that you schedule a follow-up appointment with Dr. Radford Pax as needed.

## 2014-03-29 NOTE — Progress Notes (Signed)
    Daily Group Progress Note  Program: IOP  Group Time: 9:00-10:30  Participation Level: Active  Behavioral Response: Appropriate  Type of Therapy:  Group Therapy  Summary of Progress: Pt. Smiles and laughs appropriately. Pt. Started antidepressants and reports feeling better mood. Pt. Continues to process need for healthier relationship boundaries.      Group Time: 10:30-12:00  Participation Level:  Active  Behavioral Response: Appropriate  Type of Therapy: Psycho-education Group  Summary of Progress: Pt. Participated in discussion about developing motivation.   Nancie Neas, COUNS

## 2014-03-29 NOTE — Progress Notes (Signed)
[name], [name], [name]  [address] Phone: [phone] Fax:  [phone]  Date:  03/29/2014   ID:  [name], DOB 02-Aug-1965, MRN [id number]  PCP:  Pcp Not In System  Cardiologist:  Fransico Him, MD    History of Present Illness: [name] is a 48 y.o. female with a history of anxiety, HTN, type II DM and depression.  She is being seen in [place] recently due to severe anxiety over her husbands illness with Guienne Bare syndrome.  She says that about a month ago she was under a lot of stress and intermittently she would have some chest pain that would last a few seconds and resolve and would come a few times daily.  It was located over her left breast with no radiation.  She denied any associated symptoms of SOB, diaphoresis or nausea.  Her CP was nonexertional.  It has significantly improved in the past few weeks.  She thinks is was due to mental stress.  She does walk and denies any DOE.  She denies any palpitations or dizziness.   Wt Readings from Last 3 Encounters:  03/29/14 181 lb 12.8 oz (82.464 kg)  05/05/12 184 lb (83.462 kg)  03/15/12 184 lb (83.462 kg)     Past Medical History  Diagnosis Date  . Abnormal pap   . Type II diabetes mellitus   . Hypertension   . Anxiety   . Fibroids     h/o  . Deviated septum 1984  . Groin pain     h/o  . Depression     Current Outpatient Prescriptions  Medication Sig Dispense Refill  . losartan (COZAAR) 50 MG tablet Take 50 mg by mouth daily.      . nebivolol (BYSTOLIC) 10 MG tablet Take 10 mg by mouth daily.      Marland Kitchen ALPRAZolam (XANAX) 0.5 MG tablet Take 0.5 mg by mouth at bedtime as needed.      . Amlodipine-Olmesartan (AZOR PO) Take by mouth.      . Canagliflozin (INVOKANA) 300 MG TABS Take by mouth.      . escitalopram (LEXAPRO) 20 MG tablet Take 1 tablet (20 mg total) by mouth daily.  30 tablet  0  . metFORMIN (GLUMETZA) 500 MG (MOD) 24 hr tablet Take 500 mg by mouth 2 (two) times daily at 10 am  and 4 pm.       . pravastatin (PRAVACHOL) 80 MG tablet Take 80 mg by mouth daily.       No current facility-administered medications for this visit.    Allergies:   No Known Allergies  Social History:  The patient  reports that she has never smoked. She has never used smokeless tobacco. She reports that she drinks about 1.8 - 2.4 ounces of alcohol per week. She reports that she does not use illicit drugs.   Family History:  The patient's family history includes Angina in her maternal grandmother; Anxiety disorder in her brother, maternal grandfather, maternal grandmother, and mother; Atrial fibrillation in her father; Bipolar disorder in her father; Colon cancer in her maternal grandfather; Depression in her brother, maternal grandfather, maternal grandmother, and mother; Hypertension in her father and maternal grandmother; Scoliosis in her brother; Stroke in her father.   ROS:  Please see the history of present illness.      All other systems reviewed and negative.   PHYSICAL EXAM: VS:  BP 120/78  Pulse 60  Ht 5' (1.524 m)  Wt  181 lb 12.8 oz (82.464 kg)  BMI 35.51 kg/m2 Well nourished, well developed, in no acute distress HEENT: normal Neck: no JVD Cardiac:  normal S1, S2; RRR; no murmur Lungs:  clear to auscultation bilaterally, no wheezing, rhonchi or rales Abd: soft, nontender, no hepatomegaly Ext: no edema Skin: warm and dry Neuro:  CNs 2-12 intact, no focal abnormalities noted  EKG:     Sinus bradycardia at 57bpm with no ST changes  ASSESSMENT AND PLAN:  1. Atypical Chest pain that is probably related to anxiety and stress but she has CRF including type II DM, obesity and HTN.  Her EKG is nonischemic.  I will get an ETT to assess for ischemia. 2. HTN well controlled.  Continue Bystolic/Azor and Losartan 3. Type II DM - per PCP 4. Obesity 5. Anxiety  Followup with me PRN pending results of study  Signed, Fransico Him, MD [facility] 03/29/2014 2:57 PM

## 2014-04-01 ENCOUNTER — Other Ambulatory Visit (HOSPITAL_COMMUNITY): Payer: BC Managed Care – PPO | Admitting: Psychiatry

## 2014-04-01 DIAGNOSIS — F419 Anxiety disorder, unspecified: Secondary | ICD-10-CM | POA: Diagnosis not present

## 2014-04-01 DIAGNOSIS — F4310 Post-traumatic stress disorder, unspecified: Secondary | ICD-10-CM

## 2014-04-01 NOTE — Progress Notes (Signed)
    Daily Group Progress Note  Program: IOP  Group Time: 9:00-10:30  Participation Level: Active  Behavioral Response: Appropriate  Type of Therapy:  Group Therapy  Summary of Progress: Pt. Reported that she was feeling tired and that her legs felt heavy. Pt. Attributed lethargy to medication.      Group Time: 10:30-12:00  Participation Level:  Active  Behavioral Response: Appropriate  Type of Therapy: Psycho-education Group  Summary of Progress: Pt. participated in discussion about developing forgiveness of self.   Nancie Neas, LPC

## 2014-04-02 ENCOUNTER — Other Ambulatory Visit (HOSPITAL_COMMUNITY): Payer: BC Managed Care – PPO | Admitting: Psychiatry

## 2014-04-02 DIAGNOSIS — F419 Anxiety disorder, unspecified: Secondary | ICD-10-CM | POA: Diagnosis not present

## 2014-04-02 DIAGNOSIS — F331 Major depressive disorder, recurrent, moderate: Secondary | ICD-10-CM

## 2014-04-02 DIAGNOSIS — F4310 Post-traumatic stress disorder, unspecified: Secondary | ICD-10-CM

## 2014-04-02 NOTE — Progress Notes (Signed)
    Daily Group Progress Note  Program: IOP  Group Time: 9:00-10:30  Participation Level: Active  Behavioral Response: Appropriate  Type of Therapy:  Group Therapy  Summary of Progress: Pt. Smiled and laughed appropriately. Pt. Reported that she was feeling more energy than yesterday and attributes to body adjusting to the medication.      Group Time: 10:30-12:00  Participation Level:  Active  Behavioral Response: Appropriate  Type of Therapy: Psycho-education Group  Summary of Progress: Pt. Participated in grief and loss group.   Nancie Neas, LPC

## 2014-04-03 ENCOUNTER — Other Ambulatory Visit (HOSPITAL_COMMUNITY): Payer: BC Managed Care – PPO | Admitting: Psychiatry

## 2014-04-03 DIAGNOSIS — F4310 Post-traumatic stress disorder, unspecified: Secondary | ICD-10-CM

## 2014-04-03 DIAGNOSIS — F331 Major depressive disorder, recurrent, moderate: Secondary | ICD-10-CM

## 2014-04-03 DIAGNOSIS — F419 Anxiety disorder, unspecified: Secondary | ICD-10-CM | POA: Diagnosis not present

## 2014-04-03 NOTE — Progress Notes (Signed)
    Daily Group Progress Note  Program: IOP  Group Time: 9:00-10:30  Participation Level: Active  Behavioral Response: Appropriate  Type of Therapy:  Group Therapy  Summary of Progress: Pt. Shared feelings about developing more rigid boundaries with her mother, developing acceptance of self, and feeling accomplished at this stage in her life.      Group Time: 10:30-12:00  Participation Level:  Active  Behavioral Response: Appropriate  Type of Therapy: Psycho-education Group  Summary of Progress: Pt. Participated in discussion about developing connection and vulnerability.   Nancie Neas, LPC

## 2014-04-04 ENCOUNTER — Encounter: Payer: Self-pay | Admitting: Internal Medicine

## 2014-04-04 ENCOUNTER — Other Ambulatory Visit (HOSPITAL_COMMUNITY): Payer: BC Managed Care – PPO | Admitting: Psychiatry

## 2014-04-04 DIAGNOSIS — F331 Major depressive disorder, recurrent, moderate: Secondary | ICD-10-CM

## 2014-04-04 DIAGNOSIS — F419 Anxiety disorder, unspecified: Secondary | ICD-10-CM | POA: Diagnosis not present

## 2014-04-04 DIAGNOSIS — F4310 Post-traumatic stress disorder, unspecified: Secondary | ICD-10-CM

## 2014-04-04 NOTE — Progress Notes (Signed)
    Daily Group Progress Note  Program: IOP  Group Time: 9:00-10:30  Participation Level: Active  Behavioral Response: Appropriate  Type of Therapy:  Group Therapy  Summary of Progress: Pt. Continues to process acceptance of herself, feeling and understanding her emotions without the use of alcohol to numb.     Group Time: 10:30-12:00  Participation Level:  Active  Behavioral Response: Appropriate  Type of Therapy: Psycho-education Group  Summary of Progress: Pt. Participated in discussion about developing patience and acceptance of self.   Nancie Neas, LPC

## 2014-04-05 ENCOUNTER — Other Ambulatory Visit (HOSPITAL_COMMUNITY): Payer: BC Managed Care – PPO

## 2014-04-05 ENCOUNTER — Telehealth (HOSPITAL_COMMUNITY): Payer: Self-pay | Admitting: Psychiatry

## 2014-04-08 ENCOUNTER — Other Ambulatory Visit (HOSPITAL_COMMUNITY): Payer: BC Managed Care – PPO

## 2014-04-09 ENCOUNTER — Other Ambulatory Visit (HOSPITAL_COMMUNITY): Payer: BC Managed Care – PPO | Admitting: Psychiatry

## 2014-04-09 DIAGNOSIS — F331 Major depressive disorder, recurrent, moderate: Secondary | ICD-10-CM

## 2014-04-09 DIAGNOSIS — F419 Anxiety disorder, unspecified: Secondary | ICD-10-CM | POA: Diagnosis not present

## 2014-04-09 DIAGNOSIS — F4310 Post-traumatic stress disorder, unspecified: Secondary | ICD-10-CM

## 2014-04-10 ENCOUNTER — Other Ambulatory Visit (HOSPITAL_COMMUNITY): Payer: BC Managed Care – PPO | Admitting: Psychiatry

## 2014-04-10 ENCOUNTER — Other Ambulatory Visit (HOSPITAL_COMMUNITY): Payer: BC Managed Care – PPO

## 2014-04-10 DIAGNOSIS — F419 Anxiety disorder, unspecified: Secondary | ICD-10-CM | POA: Diagnosis not present

## 2014-04-10 DIAGNOSIS — F4310 Post-traumatic stress disorder, unspecified: Secondary | ICD-10-CM

## 2014-04-10 DIAGNOSIS — F331 Major depressive disorder, recurrent, moderate: Secondary | ICD-10-CM

## 2014-04-10 NOTE — Progress Notes (Signed)
    Daily Group Progress Note  Program: IOP  Group Time: 9:00-10:30  Participation Level: Active  Behavioral Response: Appropriate  Type of Therapy:  Group Therapy  Summary of Progress: Pt. Reported that she was feeling "ok". Pt. Continues to process guilt that she feels for having depression and feeling that she is not able to be fully present for her son right now.      Group Time: 10:30-12:00  Participation Level:  Active  Behavioral Response: Appropriate  Type of Therapy: Psycho-education Group  Summary of Progress: Pt. Participated in discussion about developing self-acceptance.   Nancie Neas, LPC

## 2014-04-11 ENCOUNTER — Other Ambulatory Visit (HOSPITAL_COMMUNITY): Payer: BC Managed Care – PPO | Admitting: Psychiatry

## 2014-04-11 DIAGNOSIS — F4310 Post-traumatic stress disorder, unspecified: Secondary | ICD-10-CM

## 2014-04-11 DIAGNOSIS — F419 Anxiety disorder, unspecified: Secondary | ICD-10-CM | POA: Diagnosis not present

## 2014-04-11 DIAGNOSIS — F331 Major depressive disorder, recurrent, moderate: Secondary | ICD-10-CM

## 2014-04-11 NOTE — Progress Notes (Signed)
    Daily Group Progress Note  Program: IOP  Group Time: 9:00-10:30  Participation Level: Active  Behavioral Response: Appropriate  Type of Therapy:  Group Therapy  Summary of Progress: Pt. Was alert, responsive to feedback from others. Pt. Discussed challenge of setting healthy boundaries with her adult brother.      Group Time: 10:30-12:00  Participation Level:  Active  Behavioral Response: Appropriate  Type of Therapy: Psycho-education Group  Summary of Progress: Pt. Participated in guided meditation and breathwork exercises.   Nancie Neas, LPC

## 2014-04-12 ENCOUNTER — Other Ambulatory Visit (HOSPITAL_COMMUNITY): Payer: BC Managed Care – PPO | Admitting: Psychiatry

## 2014-04-12 DIAGNOSIS — F419 Anxiety disorder, unspecified: Secondary | ICD-10-CM | POA: Diagnosis not present

## 2014-04-12 DIAGNOSIS — F4310 Post-traumatic stress disorder, unspecified: Secondary | ICD-10-CM

## 2014-04-12 NOTE — Progress Notes (Signed)
    Daily Group Progress Note  Program: IOP  Group Time: 9:00-10:30  Participation Level: Active  Behavioral Response: Appropriate  Type of Therapy:  Group Therapy  Summary of Progress: Pt. Smiled and laughed appropriately. Pt. Presented as calm, relaxed. Pt. Continuing to process fear and surrendering control of husband's health.      Group Time: 10:30-12:00  Participation Level:  Active  Behavioral Response: Appropriate  Type of Therapy: Group Therapy  Summary of Progress: Pt. Processed feelings of loss of control and surrender.  Nancie Neas, LPC

## 2014-04-12 NOTE — Progress Notes (Signed)
    Daily Group Progress Note  Program: IOP  Group Time: 9:00-10:30  Participation Level: Active  Behavioral Response: Appropriate  Type of Therapy:  Group Therapy  Summary of Progress: Pt. Was talkative, and responsive to feedback from the group. Pt. Discussed need for healthier boundaries with her adult brother.      Group Time: 10:30-12:00  Participation Level:  Active  Behavioral Response: Appropriate  Type of Therapy: Psycho-education Group  Summary of Progress: Pt. Participated in guided meditation exercise.   Nancie Neas, LPC

## 2014-04-15 ENCOUNTER — Encounter: Payer: Self-pay | Admitting: Cardiology

## 2014-04-15 ENCOUNTER — Other Ambulatory Visit (HOSPITAL_COMMUNITY): Payer: BC Managed Care – PPO

## 2014-04-15 ENCOUNTER — Other Ambulatory Visit (HOSPITAL_COMMUNITY): Payer: BC Managed Care – PPO | Attending: Psychiatry | Admitting: Psychiatry

## 2014-04-15 DIAGNOSIS — F331 Major depressive disorder, recurrent, moderate: Secondary | ICD-10-CM

## 2014-04-15 DIAGNOSIS — E118 Type 2 diabetes mellitus with unspecified complications: Secondary | ICD-10-CM | POA: Insufficient documentation

## 2014-04-15 DIAGNOSIS — F329 Major depressive disorder, single episode, unspecified: Secondary | ICD-10-CM | POA: Diagnosis not present

## 2014-04-15 DIAGNOSIS — I10 Essential (primary) hypertension: Secondary | ICD-10-CM | POA: Insufficient documentation

## 2014-04-15 DIAGNOSIS — F411 Generalized anxiety disorder: Secondary | ICD-10-CM | POA: Diagnosis present

## 2014-04-15 DIAGNOSIS — F4310 Post-traumatic stress disorder, unspecified: Secondary | ICD-10-CM

## 2014-04-16 ENCOUNTER — Other Ambulatory Visit (HOSPITAL_COMMUNITY): Payer: BC Managed Care – PPO | Admitting: Psychiatry

## 2014-04-16 DIAGNOSIS — F331 Major depressive disorder, recurrent, moderate: Secondary | ICD-10-CM

## 2014-04-16 DIAGNOSIS — F4310 Post-traumatic stress disorder, unspecified: Secondary | ICD-10-CM

## 2014-04-16 DIAGNOSIS — F411 Generalized anxiety disorder: Secondary | ICD-10-CM | POA: Diagnosis not present

## 2014-04-16 NOTE — Progress Notes (Signed)
    Daily Group Progress Note  Program: IOP  Group Time: 9:00-10:30  Participation Level: Active  Behavioral Response: Appropriate  Type of Therapy:  Group Therapy  Summary of Progress: Pt. Presents as talkative, receptive to feedback, provides feedback to others. Pt. Smiles and laughs appropriately. Pt. Processed loss of relationship with husband, anger and guilt feelings related to husband's disability.      Group Time: 10:30-12:00  Participation Level:  Active  Behavioral Response: Appropriate  Type of Therapy: Psycho-education Group  Summary of Progress: Pt. Participated in grief and loss facilitated by Jeanella Craze.   Nancie Neas, LPC

## 2014-04-17 ENCOUNTER — Other Ambulatory Visit (HOSPITAL_COMMUNITY): Payer: BC Managed Care – PPO | Admitting: Psychiatry

## 2014-04-17 DIAGNOSIS — F411 Generalized anxiety disorder: Secondary | ICD-10-CM | POA: Diagnosis not present

## 2014-04-17 NOTE — Progress Notes (Signed)
    Daily Group Progress Note  Program: IOP  Group Time: 9:00-10:30  Participation Level: Active  Behavioral Response: Appropriate  Type of Therapy:  Group Therapy  Summary of Progress: Pt. Smiled and laughed appropriately. Pt. Reported that she is being more intentional about slowing down in her daily life, rushing less, and developing patience. Pt. Reported that she recognizes that anger was a primary coping behavior and that her son has noticed that she is less angry.     Group Time: 10:30-12:00  Participation Level:  Active  Behavioral Response: Appropriate  Type of Therapy: Psycho-education Group  Summary of Progress: Pt. Participated in discussion about developing vulnerability as resistance to shame.   Nancie Neas, LPC

## 2014-04-18 ENCOUNTER — Other Ambulatory Visit (HOSPITAL_COMMUNITY): Payer: BC Managed Care – PPO | Admitting: Psychiatry

## 2014-04-18 DIAGNOSIS — F411 Generalized anxiety disorder: Secondary | ICD-10-CM | POA: Diagnosis not present

## 2014-04-18 NOTE — Progress Notes (Signed)
    Daily Group Progress Note  Program: IOP  Group Time: 9:00-10:30  Participation Level: Active  Behavioral Response: Appropriate  Type of Therapy:  Group Therapy  Summary of Progress: Pt. Reports readiness for discharge from group as evidenced by a reduction in her anger and commitment to developing patience and slowing down in her life. Pt. Processed resistance to expressing gratitude as core beliefs of unworthiness.     Group Time: 10:30-12:00  Participation Level:  Active  Behavioral Response: Appropriate  Type of Therapy: Psycho-education Group  Summary of Progress: Pt. Participated in guided visualization with magical color shower meditation.   Nancie Neas, LPC

## 2014-04-19 ENCOUNTER — Other Ambulatory Visit (HOSPITAL_COMMUNITY): Payer: BC Managed Care – PPO | Admitting: Psychiatry

## 2014-04-19 DIAGNOSIS — F411 Generalized anxiety disorder: Secondary | ICD-10-CM | POA: Diagnosis not present

## 2014-04-19 NOTE — Progress Notes (Signed)
    Daily Group Progress Note  Program: IOP  Group Time: 9:00-10:30  Participation Level: Active  Behavioral Response: Appropriate  Type of Therapy:  Group Therapy  Summary of Progress: pt. Continues to actively participate in group, discussed progress with reduction of anger and anxiety. Pt. Reports desire for motivation and energy.     Group Time: 10:30-12:00  Participation Level:  Active  Behavioral Response: Appropriate  Type of Therapy: Psycho-education Group  Summary of Progress: Pt. Participated in discussion about developing self-care practice.   Nancie Neas, LPC

## 2014-04-19 NOTE — Patient Instructions (Signed)
Pt will complete MH-IOP on 04-22-14.  Will follow up with Dr. Doyne Keel on 05-07-14 @ 10 a.m.Marland Kitchen  Provided pt with Hospice phone number for individual counseling.  Pt plans to meet with Bonnita Nasuti (intern) for a few sessions.  Encouraged support groups.

## 2014-04-19 NOTE — Progress Notes (Signed)
    Daily Group Progress Note  Program: IOP  Group Time: 9:00-10:30  Participation Level: Active  Behavioral Response: Appropriate  Type of Therapy:  Group Therapy  Summary of Progress: Pt. Decided to discharge on Monday. Pt. Continues to report improvement in her mood and development of self-care behaviors.     Group Time: 10:30-12:00  Participation Level:  Active  Behavioral Response: Appropriate  Type of Therapy: Psycho-education Group  Summary of Progress: Pt. Participated in discussion about developing self-compassion. Pt. Watched and discussed the [employer] video.   Nancie Neas, LPC

## 2014-04-22 ENCOUNTER — Other Ambulatory Visit (HOSPITAL_COMMUNITY): Payer: BC Managed Care – PPO | Admitting: Psychiatry

## 2014-04-22 DIAGNOSIS — F331 Major depressive disorder, recurrent, moderate: Secondary | ICD-10-CM

## 2014-04-23 ENCOUNTER — Telehealth: Payer: Self-pay | Admitting: Cardiology

## 2014-04-23 ENCOUNTER — Other Ambulatory Visit (HOSPITAL_COMMUNITY): Payer: BC Managed Care – PPO

## 2014-04-23 NOTE — Progress Notes (Signed)
[name] is a 48 y.o. ,unemployed, married, caucasian female, who was a self referral to [facility]. Admitted to worsening anxiety and depressive symptoms. Other symptoms included: Poor sleep (awakenings), ruminating, irritable, isolation, fluctuating energy, anhedonia, indecisive, no motivation, low self-esteem, and crying spells. Stated symptoms worsened ~ five yrs ago today. Triggers/Stressors: 1) Husband's Illness: On 03-25-09, husband of thirteen years marriage was diagnosed with GB syndrome, in which he contracted from a flu shot. He was left paralyzed. He was on a respiratory for six months. Stayed in the hospital for ten months. Is at home now and has a full-time caregiver who comes into the home. 2) Finances: Currently receiving a monthly payout as long as husband is alive. Pt hasn't worked since January 2013. Pt was fired due to tardiness at [employer]. 3) Conflictual relationship with mother-in-law. States she doesn't trust her, due to conflict while she was visiting them. Also, reports conflict between she and her mother. 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 "She verbally abuses my father who was recently diagnosed with Alzheimer's." According to pt, he had a stroke in October 1991. 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 Pt has hx of being non-compliant with medications. States she forgot to take her medicine this morning. Pt denied any inpt psychiatric visits. Hx of seeing various therapists, no psychiatrists on an outpt basis. Admitted to one previous suicide attempt, which was an overdose in college.  Pt completed MH-IOP yesterday.  Reported decreased anger, low energy, poor appetite (eats two meals daily), and only drinking 1-2 glasses of wine at night.  States she is applying boundaries.  Denies SI/HI or A/V hallucinations.  Reports that the groups were helpful.  Planning to attend a play on Saturday.  A:  D/C today.  F/U with Dr.  Doyne Keel on 05-07-14 @ 10 a.m.  Encouraged support groups.  Pt will call Hospice for individual counseling.  R:  Pt receptive.

## 2014-04-23 NOTE — Telephone Encounter (Signed)
New problem   Pt stated she is calling to sched a stress test but there was no order in system. Please advise pt.

## 2014-04-23 NOTE — Progress Notes (Signed)
  [facility] Discharge Summary  [name] [id number]  Admission date: 03/25/2014 Discharge date: 04/23/2014  Reason for admission: depression and anxiety  Chemical Use History: none  Family of Origin Issues: none  Progress in Program Toward Treatment Goals: says anxiety and depression are greatly decreased and that she was able to "let some things out " in group that was helpful  Progress (rationale): medications seemed to have helped as well as the group support and addressing some issues she had bottled up  Lexapro was decreased to 10 mg daily as the 20 mg made her feel jumpy/a little bit wired.She will follow up with Dr Robet Leu and a Hospice therapist.  Clarene Reamer, MD 11/10/201510

## 2014-04-23 NOTE — Telephone Encounter (Signed)
To Dr. Turner for review and recommendations. 

## 2014-04-24 ENCOUNTER — Other Ambulatory Visit (HOSPITAL_COMMUNITY): Payer: BC Managed Care – PPO

## 2014-04-24 ENCOUNTER — Encounter (HOSPITAL_COMMUNITY): Payer: Self-pay | Admitting: Psychiatry

## 2014-04-25 ENCOUNTER — Other Ambulatory Visit (HOSPITAL_COMMUNITY): Payer: BC Managed Care – PPO

## 2014-04-26 ENCOUNTER — Other Ambulatory Visit (HOSPITAL_COMMUNITY): Payer: BC Managed Care – PPO

## 2014-04-26 ENCOUNTER — Other Ambulatory Visit: Payer: Self-pay

## 2014-04-26 DIAGNOSIS — R079 Chest pain, unspecified: Secondary | ICD-10-CM

## 2014-04-26 NOTE — Telephone Encounter (Signed)
GXT ordered. To [facility] for scheduling.

## 2014-04-29 ENCOUNTER — Other Ambulatory Visit (HOSPITAL_COMMUNITY): Payer: BC Managed Care – PPO

## 2014-04-30 ENCOUNTER — Other Ambulatory Visit (HOSPITAL_COMMUNITY): Payer: BC Managed Care – PPO

## 2014-05-01 ENCOUNTER — Other Ambulatory Visit (HOSPITAL_COMMUNITY): Payer: BC Managed Care – PPO

## 2014-05-02 ENCOUNTER — Other Ambulatory Visit (HOSPITAL_COMMUNITY): Payer: BC Managed Care – PPO

## 2014-05-03 ENCOUNTER — Other Ambulatory Visit (HOSPITAL_COMMUNITY): Payer: BC Managed Care – PPO

## 2014-05-07 ENCOUNTER — Ambulatory Visit (INDEPENDENT_AMBULATORY_CARE_PROVIDER_SITE_OTHER): Payer: BC Managed Care – PPO | Admitting: Psychiatry

## 2014-05-07 ENCOUNTER — Encounter (HOSPITAL_COMMUNITY): Payer: Self-pay | Admitting: Psychiatry

## 2014-05-07 VITALS — BP 126/82 | HR 56 | Ht 60.0 in | Wt 180.2 lb

## 2014-05-07 DIAGNOSIS — F411 Generalized anxiety disorder: Secondary | ICD-10-CM

## 2014-05-07 MED ORDER — ESCITALOPRAM OXALATE 10 MG PO TABS: 10.0000 mg | ORAL_TABLET | Freq: Every day | ORAL | Status: DC
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
## 2014-05-23 ENCOUNTER — Telehealth (HOSPITAL_COMMUNITY): Payer: Self-pay

## 2014-06-04 ENCOUNTER — Encounter: Payer: Self-pay | Admitting: Physician Assistant

## 2014-06-04 ENCOUNTER — Telehealth (HOSPITAL_COMMUNITY): Payer: Self-pay

## 2014-06-04 NOTE — Telephone Encounter (Signed)
Encounter complete. 

## 2014-06-11 ENCOUNTER — Telehealth (HOSPITAL_COMMUNITY): Payer: Self-pay

## 2014-06-11 ENCOUNTER — Ambulatory Visit (HOSPITAL_COMMUNITY)
Admission: RE | Admit: 2014-06-11 | Discharge: 2014-06-11 | Disposition: A | Discharge status: Home / Self Care | Payer: BC Managed Care – PPO | Source: Ambulatory Visit | Attending: Physician Assistant | Admitting: Physician Assistant

## 2014-06-11 DIAGNOSIS — R0602 Shortness of breath: Secondary | ICD-10-CM | POA: Insufficient documentation

## 2014-06-11 DIAGNOSIS — R5383 Other fatigue: Secondary | ICD-10-CM | POA: Insufficient documentation

## 2014-06-11 DIAGNOSIS — I209 Angina pectoris, unspecified: Secondary | ICD-10-CM

## 2014-06-11 DIAGNOSIS — R079 Chest pain, unspecified: Secondary | ICD-10-CM | POA: Diagnosis present

## 2014-06-11 NOTE — Procedures (Signed)
Exercise Treadmill Test  Pre-Exercise Testing Evaluation Rhythm: normal sinus                  Test  Exercise Tolerance Test Ordering MD: Fransico Him, MD    Unique Test No: 1  Treadmill:  1  Indication for ETT: chest pain - rule out ischemia  Contraindication to ETT: No   Stress Modality: exercise - treadmill  Cardiac Imaging Performed: non   Protocol: standard Bruce - maximal  Max BP:  152/92  Max MPHR (bpm):  171 85% MPR (bpm):  145  MPHR obtained (bpm):  129 % MPHR obtained:  75  Reached 85% MPHR (min:sec):  0 Total Exercise Time (min-sec):  7  Workload in METS:  8.6 Borg Scale: 16  Reason ETT Terminated:  SOB and Fatigue, Pt. stated she had to stop    ST Segment Analysis At Rest: normal ST segments - no evidence of significant ST depression With Exercise: no evidence of significant ST depression  Other Information Arrhythmia:  No Angina during ETT:  absent (0) Quality of ETT:  non-diagnostic  ETT Interpretation:  normal - no evidence of ischemia by ST analysis  Comments: Non diagnostic because of inability to reach target HR  Recommendations: F/U with Dr. Radford Pax

## 2014-06-11 NOTE — Telephone Encounter (Signed)
Encounter complete. 

## 2014-06-13 ENCOUNTER — Telehealth: Payer: Self-pay | Admitting: Cardiology

## 2014-06-13 DIAGNOSIS — R079 Chest pain, unspecified: Secondary | ICD-10-CM

## 2014-06-13 NOTE — Telephone Encounter (Signed)
-----   Message from Sueanne Margarita, MD sent at 06/12/2014  6:47 PM EST ----- Please let patient know that stress test was fine but she did not reach her target HR so nondiagnostic study - please order a stress echo

## 2014-06-13 NOTE — Telephone Encounter (Signed)
New message ° ° ° ° °Returning Katy's call °

## 2014-06-13 NOTE — Telephone Encounter (Signed)
Patient informed of results and verbal understanding expressed.  Stress ECHO ordered for scheduling.

## 2014-06-13 NOTE — Telephone Encounter (Signed)
Left message to call back  

## 2014-06-13 NOTE — Telephone Encounter (Signed)
Follow up      Returning [name]'s call to get test results

## 2014-06-18 ENCOUNTER — Encounter: Payer: Self-pay | Admitting: Physician Assistant

## 2014-06-26 ENCOUNTER — Telehealth: Payer: Self-pay | Admitting: Cardiology

## 2014-06-26 ENCOUNTER — Other Ambulatory Visit (HOSPITAL_COMMUNITY): Payer: BLUE CROSS/BLUE SHIELD

## 2014-06-26 ENCOUNTER — Telehealth: Payer: Self-pay

## 2014-06-26 DIAGNOSIS — R0789 Other chest pain: Secondary | ICD-10-CM

## 2014-06-26 NOTE — Telephone Encounter (Signed)
Per Dr. Radford Pax, cancelled stress ECHO and ordered lexiscan myoview for scheduling.  Patient notified.

## 2014-06-26 NOTE — Telephone Encounter (Signed)
New problem   Pt returning your call concerning  test.

## 2014-06-26 NOTE — Telephone Encounter (Signed)
Explained to patient Lexiscan instructions and informed her she will be called to schedule. Patient thankful for callback.

## 2014-07-09 ENCOUNTER — Ambulatory Visit (HOSPITAL_COMMUNITY): Payer: Self-pay | Admitting: Psychiatry

## 2014-07-16 ENCOUNTER — Ambulatory Visit (INDEPENDENT_AMBULATORY_CARE_PROVIDER_SITE_OTHER): Payer: BLUE CROSS/BLUE SHIELD | Admitting: Psychiatry

## 2014-07-16 ENCOUNTER — Encounter (HOSPITAL_COMMUNITY): Payer: Self-pay | Admitting: Psychiatry

## 2014-07-16 VITALS — BP 121/61 | HR 68 | Ht 60.0 in | Wt 185.8 lb

## 2014-07-16 DIAGNOSIS — F411 Generalized anxiety disorder: Secondary | ICD-10-CM
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
## 2014-07-17 ENCOUNTER — Ambulatory Visit (HOSPITAL_COMMUNITY): Payer: BLUE CROSS/BLUE SHIELD | Attending: Internal Medicine | Admitting: Radiology

## 2014-07-17 DIAGNOSIS — R0789 Other chest pain: Secondary | ICD-10-CM | POA: Diagnosis not present

## 2014-07-17 MED ORDER — TECHNETIUM TC 99M SESTAMIBI GENERIC - CARDIOLITE
33.0000 | Freq: Once | INTRAVENOUS | Status: AC | PRN
  Administered 2014-07-17: 33 via INTRAVENOUS

## 2014-07-17 MED ORDER — TECHNETIUM TC 99M SESTAMIBI GENERIC - CARDIOLITE
11.0000 | Freq: Once | INTRAVENOUS | Status: AC | PRN
  Administered 2014-07-17: 11 via INTRAVENOUS

## 2014-07-17 MED ORDER — REGADENOSON 0.4 MG/5ML IV SOLN
0.4000 mg | Freq: Once | INTRAVENOUS | Status: AC
  Administered 2014-07-17: 0.4 mg via INTRAVENOUS

## 2014-07-17 NOTE — Progress Notes (Signed)
[facility] [address] [place], [place] [address] [phone]    Cardiology Nuclear Med Study  [name] is a 49 y.o. female     MRN : [id number]     DOB: Nov 05, 1965  Procedure Date: 07/17/2014  Nuclear Med Background Indication for Stress Test:  Evaluation for Ischemia and 12/15 Non-diagnostic GXT History:  No known CAD Cardiac Risk Factors: Hypertension and NIDDM  Symptoms:  Chest Pain   Nuclear Pre-Procedure Caffeine/Decaff Intake:  None NPO After: 8:00am   Lungs:  clear O2 Sat: 98% on room air. IV 0.9% NS with Angio Cath:  22g  IV Site: R Hand  IV Started by:  Crissie Figures, RN  Chest Size (in):  42 Cup Size: B  Height: 5' (1.524 m)  Weight:  182 lb (82.555 kg)  BMI:  Body mass index is 35.54 kg/(m^2). Tech Comments:  Bystolic held x 24 hrs    Nuclear Med Study 1 or 2 day study: 1 day  Stress Test Type:  Treadmill/Lexiscan  Reading MD: N/A  Order Authorizing Provider:  Fransico Him, MD  Resting Radionuclide: Technetium 57m Sestamibi  Resting Radionuclide Dose: 11.0 mCi   Stress Radionuclide:  Technetium 32m Sestamibi  Stress Radionuclide Dose: 33.0 mCi           Stress Protocol Rest HR: 57 Stress HR: 98  Rest BP: 117/84 Stress BP: 142/98  Exercise Time (min): n/a METS: n/a   Predicted Max HR: 171 bpm % Max HR: 57.31 bpm Rate Pressure Product: 14896   Dose of Adenosine (mg):  n/a Dose of Lexiscan: 0.4 mg  Dose of Atropine (mg): n/a Dose of Dobutamine: n/a mcg/kg/min (at max HR)  Stress Test Technologist: Glade Lloyd, BS-ES  Nuclear Technologist:  Earl Many, CNMT     Rest Procedure:  Myocardial perfusion imaging was performed at rest 45 minutes following the intravenous administration of Technetium 36m Sestamibi. Rest ECG: NSR - Normal EKG  Stress Procedure:  The patient received IV Lexiscan 0.4 mg over 15-seconds with concurrent low level exercise and then Technetium 31m Sestamibi was injected at 30-seconds while the  patient continued walking one more minute.  Quantitative spect images were obtained after a 45-minute delay.  During the infusion of Lexiscan the patient complained of SOB and feeling flushed.  These symptoms resolved in recovery.  Stress ECG: No significant change from baseline ECG  QPS Raw Data Images:  Normal; no motion artifact; normal heart/lung ratio. Stress Images:  Small, mild apical perfusion defect. Rest Images:  Small, mild apical perfusion defect. Subtraction (SDS):  Fixed, small mild apical perfusion defect. Transient Ischemic Dilatation (Normal <1.22):  0.92 Lung/Heart Ratio (Normal <0.45):  0.23  Quantitative Gated Spect Images QGS EDV:  80 ml QGS ESV:  31 ml  Impression Exercise Capacity:  Lexiscan with low level exercise. BP Response:  Normal blood pressure response. Clinical Symptoms:  Short of breath ECG Impression:  No significant ST segment change suggestive of ischemia. Comparison with Prior Nuclear Study: No images to compare  Overall Impression:  Low risk stress nuclear study with a small, mild fixed apical perfusion defect.  This most likely represents attenuation given normal apical wall motion. .  LV Ejection Fraction: 62%.  LV Wall Motion:  NL LV Function; NL Wall Motion\  [name] 07/17/2014

## 2014-07-18 ENCOUNTER — Other Ambulatory Visit: Payer: Self-pay

## 2014-07-18 DIAGNOSIS — Z1231 Encounter for screening mammogram for malignant neoplasm of breast: Secondary | ICD-10-CM

## 2014-07-19 ENCOUNTER — Telehealth: Payer: Self-pay | Admitting: *Deleted

## 2014-07-19 DIAGNOSIS — R079 Chest pain, unspecified: Secondary | ICD-10-CM

## 2014-07-19 NOTE — Telephone Encounter (Signed)
Patient notified of stress test results. Patient verbalized understanding and agreement with current treatment plan.

## 2014-07-23 ENCOUNTER — Encounter: Payer: Self-pay | Admitting: Cardiology

## 2014-07-23 NOTE — Telephone Encounter (Signed)
New Msg        Pt calling, states she was informed to call the office today but is unsure why.    Please return pt call.

## 2014-07-23 NOTE — Telephone Encounter (Signed)
Pt would like  to talk with [facility]. Pt will call tomorrow when [facility] when is  here.

## 2014-07-24 NOTE — Telephone Encounter (Signed)
This encounter was created in error - please disregard.

## 2014-07-25 NOTE — Telephone Encounter (Signed)
Instructed patient to check ECHO (per nuclear stress test results). Patient agrees with treatment plan.  ECHO ordered for scheduling.

## 2014-07-25 NOTE — Telephone Encounter (Signed)
Follow up  ° ° °Patient returning call back to nurse  °

## 2014-08-07 ENCOUNTER — Ambulatory Visit (HOSPITAL_COMMUNITY): Payer: BLUE CROSS/BLUE SHIELD | Attending: Cardiovascular Disease | Admitting: Cardiology

## 2014-08-07 DIAGNOSIS — E669 Obesity, unspecified: Secondary | ICD-10-CM | POA: Diagnosis not present

## 2014-08-07 DIAGNOSIS — R079 Chest pain, unspecified: Secondary | ICD-10-CM | POA: Diagnosis not present

## 2014-08-07 DIAGNOSIS — I10 Essential (primary) hypertension: Secondary | ICD-10-CM | POA: Diagnosis not present

## 2014-08-07 DIAGNOSIS — E119 Type 2 diabetes mellitus without complications: Secondary | ICD-10-CM | POA: Diagnosis not present

## 2014-08-07 NOTE — Progress Notes (Signed)
Echo performed. 

## 2014-08-08 ENCOUNTER — Telehealth: Payer: Self-pay | Admitting: Cardiology

## 2014-08-08 NOTE — Telephone Encounter (Signed)
New message ° ° ° ° ° °Want echo results °

## 2014-08-08 NOTE — Telephone Encounter (Signed)
Pt returned call but hung up before operator could transfer. Attempted to call pt back 3 times but it goes directly to voicemail. Left message for pt to call back.

## 2014-08-08 NOTE — Telephone Encounter (Signed)
Left pt a message to call back. 

## 2014-08-08 NOTE — Telephone Encounter (Signed)
Pt returned call and I informed pt of echo results.  Pt verbalized understanding.

## 2014-08-15 ENCOUNTER — Encounter (HOSPITAL_COMMUNITY): Payer: Self-pay

## 2014-09-04 ENCOUNTER — Ambulatory Visit: Payer: Self-pay

## 2014-09-17 ENCOUNTER — Ambulatory Visit
Admission: RE | Admit: 2014-09-17 | Discharge: 2014-09-17 | Disposition: A | Discharge status: Home / Self Care | Payer: BLUE CROSS/BLUE SHIELD | Source: Ambulatory Visit

## 2014-09-17 DIAGNOSIS — Z1231 Encounter for screening mammogram for malignant neoplasm of breast: Secondary | ICD-10-CM

## 2014-10-17 ENCOUNTER — Ambulatory Visit (HOSPITAL_COMMUNITY): Payer: Self-pay | Admitting: Psychiatry

## 2014-10-22 ENCOUNTER — Ambulatory Visit (HOSPITAL_COMMUNITY): Payer: Self-pay | Admitting: Psychiatry

## 2015-05-26 ENCOUNTER — Other Ambulatory Visit: Payer: Self-pay

## 2015-05-26 DIAGNOSIS — Z1231 Encounter for screening mammogram for malignant neoplasm of breast: Secondary | ICD-10-CM

## 2015-05-27 ENCOUNTER — Encounter: Payer: Self-pay | Admitting: Internal Medicine

## 2015-05-27 NOTE — Telephone Encounter (Signed)
A user error has taken place.

## 2015-06-18 ENCOUNTER — Emergency Department (HOSPITAL_COMMUNITY): Payer: Medicaid Other

## 2015-06-18 ENCOUNTER — Emergency Department (HOSPITAL_COMMUNITY)
Admission: EM | Admit: 2015-06-18 | Discharge: 2015-06-18 | Disposition: A | Discharge status: Home / Self Care | Payer: Medicaid Other | Attending: Physician Assistant | Admitting: Physician Assistant

## 2015-06-18 ENCOUNTER — Encounter (HOSPITAL_COMMUNITY): Payer: Self-pay | Admitting: *Deleted

## 2015-06-18 DIAGNOSIS — R451 Restlessness and agitation: Secondary | ICD-10-CM | POA: Insufficient documentation

## 2015-06-18 DIAGNOSIS — F329 Major depressive disorder, single episode, unspecified: Secondary | ICD-10-CM | POA: Insufficient documentation

## 2015-06-18 DIAGNOSIS — N200 Calculus of kidney: Secondary | ICD-10-CM | POA: Diagnosis not present

## 2015-06-18 DIAGNOSIS — F419 Anxiety disorder, unspecified: Secondary | ICD-10-CM | POA: Diagnosis not present

## 2015-06-18 DIAGNOSIS — Z79899 Other long term (current) drug therapy: Secondary | ICD-10-CM | POA: Insufficient documentation

## 2015-06-18 DIAGNOSIS — Z86018 Personal history of other benign neoplasm: Secondary | ICD-10-CM | POA: Insufficient documentation

## 2015-06-18 DIAGNOSIS — R109 Unspecified abdominal pain: Secondary | ICD-10-CM | POA: Diagnosis present

## 2015-06-18 DIAGNOSIS — I10 Essential (primary) hypertension: Secondary | ICD-10-CM | POA: Insufficient documentation

## 2015-06-18 DIAGNOSIS — E119 Type 2 diabetes mellitus without complications: Secondary | ICD-10-CM | POA: Diagnosis not present

## 2015-06-18 DIAGNOSIS — Z3202 Encounter for pregnancy test, result negative: Secondary | ICD-10-CM | POA: Insufficient documentation

## 2015-06-18 LAB — CBC WITH DIFFERENTIAL/PLATELET
Basophils Absolute: 0 10*3/uL (ref 0.0–0.1)
Basophils Relative: 0 %
Eosinophils Absolute: 0.1 10*3/uL (ref 0.0–0.7)
Eosinophils Relative: 2 %
HEMATOCRIT: 38.9 % (ref 36.0–46.0)
HEMOGLOBIN: 13.3 g/dL (ref 12.0–15.0)
LYMPHS ABS: 1.5 10*3/uL (ref 0.7–4.0)
Lymphocytes Relative: 22 %
MCH: 30.8 pg (ref 26.0–34.0)
MCHC: 34.2 g/dL (ref 30.0–36.0)
MCV: 90 fL (ref 78.0–100.0)
MONOS PCT: 4 %
Monocytes Absolute: 0.3 10*3/uL (ref 0.1–1.0)
NEUTROS ABS: 5 10*3/uL (ref 1.7–7.7)
NEUTROS PCT: 72 %
Platelets: 174 10*3/uL (ref 150–400)
RBC: 4.32 MIL/uL (ref 3.87–5.11)
RDW: 12.2 % (ref 11.5–15.5)
WBC: 6.9 10*3/uL (ref 4.0–10.5)

## 2015-06-18 LAB — COMPREHENSIVE METABOLIC PANEL
ALBUMIN: 3.7 g/dL (ref 3.5–5.0)
ALK PHOS: 74 U/L (ref 38–126)
ALT: 20 U/L (ref 14–54)
ANION GAP: 11 (ref 5–15)
AST: 18 U/L (ref 15–41)
BILIRUBIN TOTAL: 1.1 mg/dL (ref 0.3–1.2)
BUN: 12 mg/dL (ref 6–20)
CALCIUM: 9 mg/dL (ref 8.9–10.3)
CO2: 22 mmol/L (ref 22–32)
CREATININE: 0.76 mg/dL (ref 0.44–1.00)
Chloride: 99 mmol/L — ABNORMAL LOW (ref 101–111)
GFR calc non Af Amer: >60 mL/min (ref >60)
GLUCOSE: 311 mg/dL — AB (ref 65–99)
Potassium: 3.8 mmol/L (ref 3.5–5.1)
SODIUM: 132 mmol/L — AB (ref 135–145)
TOTAL PROTEIN: 7.6 g/dL (ref 6.5–8.1)

## 2015-06-18 LAB — URINE MICROSCOPIC-ADD ON: RBC / HPF: NONE SEEN RBC/hpf (ref 0–5)

## 2015-06-18 LAB — URINALYSIS, ROUTINE W REFLEX MICROSCOPIC
BILIRUBIN URINE: NEGATIVE
Hgb urine dipstick: NEGATIVE
KETONES UR: 15 mg/dL — AB
LEUKOCYTES UA: NEGATIVE
Nitrite: NEGATIVE
PH: 5 (ref 5.0–8.0)
PROTEIN: NEGATIVE mg/dL
Specific Gravity, Urine: 1.023 (ref 1.005–1.030)

## 2015-06-18 LAB — POC URINE PREG, ED: Preg Test, Ur: NEGATIVE

## 2015-06-18 LAB — LIPASE, BLOOD: Lipase: 30 U/L (ref 11–51)

## 2015-06-18 MED ORDER — TAMSULOSIN HCL 0.4 MG PO CAPS: 0.4000 mg | ORAL_CAPSULE | Freq: Every day | ORAL | Status: DC

## 2015-06-18 MED ORDER — MORPHINE SULFATE (PF) 4 MG/ML IV SOLN
4.0000 mg | Freq: Once | INTRAVENOUS | Status: AC
  Administered 2015-06-18: 4 mg via INTRAVENOUS
  Filled 2015-06-18: qty 1

## 2015-06-18 MED ORDER — ONDANSETRON HCL 4 MG/2ML IJ SOLN
4.0000 mg | Freq: Once | INTRAMUSCULAR | Status: AC
  Administered 2015-06-18: 4 mg via INTRAVENOUS
  Filled 2015-06-18: qty 2

## 2015-06-18 MED ORDER — KETOROLAC TROMETHAMINE 30 MG/ML IJ SOLN
30.0000 mg | Freq: Once | INTRAMUSCULAR | Status: AC
  Administered 2015-06-18: 30 mg via INTRAVENOUS
  Filled 2015-06-18: qty 1

## 2015-06-18 MED ORDER — IBUPROFEN 800 MG PO TABS: 800.0000 mg | ORAL_TABLET | Freq: Three times a day (TID) | ORAL | Status: DC

## 2015-06-18 MED ORDER — SODIUM CHLORIDE 0.9 % IV BOLUS (SEPSIS)
1000.0000 mL | Freq: Once | INTRAVENOUS | Status: AC
  Administered 2015-06-18: 1000 mL via INTRAVENOUS

## 2015-06-18 MED ORDER — ONDANSETRON HCL 4 MG PO TABS: 4.0000 mg | ORAL_TABLET | Freq: Three times a day (TID) | ORAL | Status: DC | PRN

## 2015-06-18 MED ORDER — OXYCODONE-ACETAMINOPHEN 5-325 MG PO TABS: 1.0000 | ORAL_TABLET | Freq: Four times a day (QID) | ORAL | Status: DC | PRN

## 2015-06-18 NOTE — ED Notes (Signed)
Pt reports left side flank pain sudden onset this am, denies pain with urination. Pt very anxious at triage.

## 2015-06-18 NOTE — Discharge Instructions (Signed)
Please return with fever or if pain gets worse or lasts more than 2 days. You may also need to follow up with urology (number given) for kidney stones.   Dietary Guidelines to Help Prevent Kidney Stones Your risk of kidney stones can be decreased by adjusting the foods you eat. The most important thing you can do is drink enough fluid. You should drink enough fluid to keep your urine clear or pale yellow. The following guidelines provide specific information for the type of kidney stone you have had. GUIDELINES ACCORDING TO TYPE OF KIDNEY STONE Calcium Oxalate Kidney Stones  Reduce the amount of salt you eat. Foods that have a lot of salt cause your body to release excess calcium into your urine. The excess calcium can combine with a substance called oxalate to form kidney stones.  Reduce the amount of animal protein you eat if the amount you eat is excessive. Animal protein causes your body to release excess calcium into your urine. Ask your dietitian how much protein from animal sources you should be eating.  Avoid foods that are high in oxalates. If you take vitamins, they should have less than 500 mg of vitamin C. Your body turns vitamin C into oxalates. You do not need to avoid fruits and vegetables high in vitamin C. Calcium Phosphate Kidney Stones  Reduce the amount of salt you eat to help prevent the release of excess calcium into your urine.  Reduce the amount of animal protein you eat if the amount you eat is excessive. Animal protein causes your body to release excess calcium into your urine. Ask your dietitian how much protein from animal sources you should be eating.  Get enough calcium from food or take a calcium supplement (ask your dietitian for recommendations). Food sources of calcium that do not increase your risk of kidney stones include:  Broccoli.  Dairy products, such as cheese and yogurt.  Pudding. Uric Acid Kidney Stones  Do not have more than 6 oz of animal  protein per day. FOOD SOURCES Animal Protein Sources  Meat (all types).  Poultry.  Eggs.  Fish, seafood. Foods High in [employer] seasonings.  Soy sauce.  Teriyaki sauce.  Cured and processed meats.  Salted crackers and snack foods.  Fast food.  Canned soups and most canned foods. Foods High in Oxalates  Grains:  Amaranth.  Barley.  Grits.  Wheat germ.  Bran.  Buckwheat flour.  All bran cereals.  Pretzels.  Whole wheat bread.  Vegetables:  Beans (wax).  Beets and beet greens.  Collard greens.  Eggplant.  [place].  Leeks.  Okra.  Parsley.  R[place]abagas.  Spinach.  Swiss chard.  Tomato paste.  Fried potatoes.  Sweet potatoes.  Fruits:  Red currants.  Figs.  Kiwi.  Rhubarb.  Meat and Other Protein Sources:  Beans (dried).  Soy burgers and other soybean products.  Miso.  Nuts (peanuts, almonds, pecans, cashews, hazelnuts).  Nut butters.  Sesame seeds and tahini (paste made of sesame seeds).  Poppy seeds.  Beverages:  Chocolate drink mixes.  Soy milk.  Instant iced tea.  Juices made from high-oxalate fruits or vegetables.  Other:  Carob.  Chocolate.  Fruitcake.  Marmalades.   This information is not intended to replace advice given to you by your health care provider. Make sure you discuss any questions you have with your health care provider.   Document Released: 09/25/2010 Document Revised: 06/05/2013 Document Reviewed: 04/27/2013 Elsevier Interactive Patient Education [employer].

## 2015-06-18 NOTE — ED Provider Notes (Signed)
CSN: [id number]     Arrival date & time 06/18/15  1011 History   First MD Initiated Contact with Patient 06/18/15 1112     Chief Complaint  Patient presents with  . Flank Pain     (Consider location/radiation/quality/duration/timing/severity/associated sxs/prior Treatment) HPI   Patient is a 50 year old female presenting with acute onset of left flank pain. This started directly prior to arrival. She's got a history of anxiety fibroids and type 2 diabetes. Patient reports the pain is located in her left flank. Radiating mildly into her abdomen. Patient's never had a history of stones in the past. Patient has no urinary symptoms. No fever. Patient vomited once. She is having trouble getting comfortable. Has not taken anything for the pain.  Past Medical History  Diagnosis Date  . Abnormal pap   . Type II diabetes mellitus ([place])   . Hypertension   . Anxiety   . Fibroids     h/o  . Deviated septum 1984  . Groin pain     h/o  . Depression    Past Surgical History  Procedure Laterality Date  . Cesarean section     Family History  Problem Relation Age of Onset  . Scoliosis Brother   . Anxiety disorder Brother   . Depression Brother   . Angina Maternal Grandmother   . Hypertension Maternal Grandmother   . Anxiety disorder Maternal Grandmother   . Depression Maternal Grandmother   . Atrial fibrillation Father   . Hypertension Father   . Stroke Father   . Bipolar disorder Father   . Colon cancer Maternal Grandfather   . Anxiety disorder Maternal Grandfather   . Depression Maternal Grandfather   . Depression Mother   . Anxiety disorder Mother   . Suicidality Paternal Grandfather    Social History  Substance Use Topics  . Smoking status: Never Smoker   . Smokeless tobacco: Never Used  . Alcohol Use: 6.0 - 7.2 oz/week    10-12 Glasses of wine per week     Comment: 10-12 glasses of wine per week   OB History    Gravida Para Term Preterm AB TAB SAB Ectopic Multiple Living    2 1   1     1      Review of Systems  Constitutional: Negative for activity change and fatigue.  HENT: Negative for congestion.   Eyes: Negative for discharge.  Respiratory: Negative for cough.   Cardiovascular: Negative for chest pain.  Gastrointestinal: Negative for abdominal distention.  Genitourinary: Positive for flank pain. Negative for dysuria and difficulty urinating.  Musculoskeletal: Negative for joint swelling.  Skin: Negative for rash.  Allergic/Immunologic: Negative for immunocompromised state.  Neurological: Negative for seizures.  Psychiatric/Behavioral: Positive for agitation.      Allergies  Review of patient's allergies indicates no known allergies.  Home Medications   Prior to Admission medications   Medication Sig Start Date End Date Taking? Authorizing Provider  ALPRAZolam Duanne Moron) 0.5 MG tablet Take 0.5 mg by mouth at bedtime as needed.    Historical Provider, MD  Amlodipine-Olmesartan (AZOR PO) Take by mouth.    Historical Provider, MD  Canagliflozin (INVOKANA) 300 MG TABS Take by mouth.    Historical Provider, MD  ibuprofen (ADVIL,MOTRIN) 800 MG tablet Take 1 tablet (800 mg total) by mouth 3 (three) times daily. 06/18/15   Jericho Cieslik Lyn Markel Mergenthaler, MD  losartan (COZAAR) 50 MG tablet Take 50 mg by mouth daily.    Historical Provider, MD  metFORMIN (GLUMETZA) 500 MG (  MOD) 24 hr tablet Take 500 mg by mouth 2 (two) times daily at 10 am and 4 pm.     Historical Provider, MD  nebivolol (BYSTOLIC) 10 MG tablet Take 10 mg by mouth daily.    Historical Provider, MD  ondansetron (ZOFRAN) 4 MG tablet Take 1 tablet (4 mg total) by mouth every 8 (eight) hours as needed for nausea or vomiting. 06/18/15   Lakeisha Waldrop Lyn Akeyla Molden, MD  oxyCODONE-acetaminophen (PERCOCET/ROXICET) 5-325 MG tablet Take 1 tablet by mouth every 6 (six) hours as needed for severe pain. 06/18/15   Kadarious Dikes Lyn Lyne Khurana, MD  pravastatin (PRAVACHOL) 80 MG tablet Take 80 mg by mouth daily.    Historical  Provider, MD  tamsulosin (FLOMAX) 0.4 MG CAPS capsule Take 1 capsule (0.4 mg total) by mouth daily. 06/18/15   Maebelle Sulton Lyn Lebron Nauert, MD   BP 187/104 mmHg  Pulse 89  Resp 20  SpO2 98% Physical Exam  Constitutional: She is oriented to person, place, and time. She appears well-developed and well-nourished.  HENT:  Head: Normocephalic and atraumatic.  Eyes: Conjunctivae are normal. Right eye exhibits no discharge.  Neck: Neck supple.  Cardiovascular: Normal rate, regular rhythm and normal heart sounds.   No murmur heard. Pulmonary/Chest: Effort normal and breath sounds normal. She has no wheezes. She has no rales.  Abdominal: Soft. She exhibits no distension. There is no tenderness.  No flank pain.  Musculoskeletal: Normal range of motion. She exhibits no edema.  Neurological: She is oriented to person, place, and time. No cranial nerve deficit.  Skin: Skin is warm and dry. No rash noted. She is not diaphoretic.  Psychiatric: She has a normal mood and affect. Her behavior is normal.  Nursing note and vitals reviewed.   ED Course  Procedures (including critical care time) Labs Review Labs Reviewed  COMPREHENSIVE METABOLIC PANEL - Abnormal; Notable for the following:    Sodium 132 (*)    Chloride 99 (*)    Glucose, Bld 311 (*)    All other components within normal limits  URINALYSIS, ROUTINE W REFLEX MICROSCOPIC (NOT AT [facility]) - Abnormal; Notable for the following:    APPearance CLOUDY (*)    Glucose, UA >1000 (*)    Ketones, ur 15 (*)    All other components within normal limits  URINE MICROSCOPIC-ADD ON - Abnormal; Notable for the following:    Squamous Epithelial / LPF 0-5 (*)    Bacteria, UA FEW (*)    Crystals URIC ACID CRYSTALS (*)    All other components within normal limits  URINE CULTURE  CBC WITH DIFFERENTIAL/PLATELET  LIPASE, BLOOD  POC URINE PREG, ED    Imaging Review Ct Renal Stone Study  06/18/2015  CLINICAL DATA:  Left flank pain since this morning EXAM: CT  ABDOMEN AND PELVIS WITHOUT CONTRAST TECHNIQUE: Multidetector CT imaging of the abdomen and pelvis was performed following the standard protocol without IV contrast. COMPARISON:  None. FINDINGS: Lower chest and abdominal wall:  Small fatty umbilical hernia Hepatobiliary: Hepatic steatosis with central sparing.No evidence of biliary obstruction or stone. Pancreas: Unremarkable. Spleen: Unremarkable. Adrenals/Urinary Tract:  Negative adrenals. Left renal expansion, perinephric edema, and mild hydronephrosis from a 1-2 mm stone at the left ureteral vesicular junction. There is a punctate left upper pole renal calculus. No right nephrolithiasis. Unremarkable bladder. Reproductive:No pathologic findings. Stomach/Bowel:  No obstruction. No appendicitis. Vascular/Lymphatic: No acute vascular abnormality. No mass or adenopathy. Peritoneal: No ascites or pneumoperitoneum. Musculoskeletal: No acute abnormalities.  Osteitis pubis. IMPRESSION: 1. 1-2  mm stone at the left UVJ with obstructive uropathy. 2. Punctate left nephrolithiasis. 3. Hepatic steatosis. Electronically Signed   By: Monte Fantasia M.D.   On: 06/18/2015 13:13   I have personally reviewed and evaluated these images and lab results as part of my medical decision-making.   EKG Interpretation None      MDM   Final diagnoses:  Left flank pain  Kidney stone    Patient is a 50 year old female presenting with flank pain to the left flank. Endorses nausea. Patient having trouble get comfortable. Patient's very anxious on exam. She is worried that she might have a stroke due to her high blood pressure. On exam she is no tenderness to CVA or abdomen. We will get CT stone study. I suspect that she is having a renal stone with renal colic. We'll make sure that is a possible size that she does not have an infection.   1:51 PM 1 mm stone found, mild hydro. No infection.  Will give meds to help with symtpoms at home. Pt understands return precuations.  Ambulatory and taking PO at time of discharge.    Smitty Ackerley Julio Alm, MD 06/18/15 1352

## 2015-06-19 LAB — URINE CULTURE

## 2015-08-21 ENCOUNTER — Encounter: Payer: Self-pay | Admitting: Internal Medicine

## 2015-09-18 ENCOUNTER — Ambulatory Visit: Payer: Self-pay

## 2015-10-07 ENCOUNTER — Ambulatory Visit (AMBULATORY_SURGERY_CENTER): Payer: Self-pay

## 2015-10-07 VITALS — Ht 60.0 in | Wt 185.0 lb

## 2015-10-07 DIAGNOSIS — Z1211 Encounter for screening for malignant neoplasm of colon: Secondary | ICD-10-CM

## 2015-10-07 NOTE — Progress Notes (Signed)
No allergies to eggs or soy No diet meds No home oxygen No past problems with anesthesia  Has email and internet; refused emmi

## 2015-10-21 ENCOUNTER — Encounter: Payer: Self-pay | Admitting: Internal Medicine

## 2015-10-23 ENCOUNTER — Encounter: Payer: Self-pay | Admitting: Internal Medicine

## 2015-10-27 ENCOUNTER — Ambulatory Visit: Payer: Self-pay

## 2015-10-31 ENCOUNTER — Ambulatory Visit: Payer: Self-pay

## 2015-11-13 ENCOUNTER — Ambulatory Visit (AMBULATORY_SURGERY_CENTER): Payer: Medicaid Other | Admitting: Internal Medicine

## 2015-11-13 ENCOUNTER — Encounter: Payer: Self-pay | Admitting: Internal Medicine

## 2015-11-13 VITALS — BP 133/91 | HR 64 | Temp 98.4°F | Resp 11 | Ht 60.0 in | Wt 185.0 lb

## 2015-11-13 DIAGNOSIS — Z1211 Encounter for screening for malignant neoplasm of colon: Secondary | ICD-10-CM

## 2015-11-13 LAB — GLUCOSE, CAPILLARY
Glucose-Capillary: 208 mg/dL — ABNORMAL HIGH (ref 65–99)
Glucose-Capillary: 147 mg/dL — ABNORMAL HIGH (ref 65–99)

## 2015-11-13 MED ORDER — SODIUM CHLORIDE 0.9 % IV SOLN: 500.0000 mL | INTRAVENOUS | Status: DC

## 2015-11-13 NOTE — Op Note (Signed)
[facility] Patient Name: Matisyn Post Procedure Date: 11/13/2015 12:52 PM MRN: [id number] Endoscopist: Gatha Mayer , MD Age: 50 Referring MD:  Date of Birth: [birth year] Gender: Female Procedure:                Colonoscopy Indications:              Screening for colorectal malignant neoplasm Medicines:                Propofol per Anesthesia, Monitored Anesthesia Care Procedure:                Pre-Anesthesia Assessment:                           - Prior to the procedure, a History and Physical                            was performed, and patient medications and                            allergies were reviewed. The patient's tolerance of                            previous anesthesia was also reviewed. The risks                            and benefits of the procedure and the sedation                            options and risks were discussed with the patient.                            All questions were answered, and informed consent                            was obtained. Prior Anticoagulants: The patient has                            taken no previous anticoagulant or antiplatelet                            agents. ASA Grade Assessment: II - A patient with                            mild systemic disease. After reviewing the risks                            and benefits, the patient was deemed in                            satisfactory condition to undergo the procedure.                           After obtaining informed consent, the colonoscope  was passed under direct vision. Throughout the                            procedure, the patient's blood pressure, pulse, and                            oxygen saturations were monitored continuously. The                            Model CF-HQ190L [id number]) scope was introduced                            through the anus and advanced to the the cecum,                            identified by  appendiceal orifice and ileocecal                            valve. The terminal ileum, the appendiceal orifice                            and the rectum were photographed. The quality of                            the bowel preparation was excellent. The                            colonoscopy was performed without difficulty. The                            patient tolerated the procedure well. The bowel                            preparation used was Miralax. Scope In: 1:13:43 PM Scope Out: 1:23:45 PM Scope Withdrawal Time: 0 hours 7 minutes 26 seconds  Total Procedure Duration: 0 hours 10 minutes 2 seconds  Findings:                 The perianal and digital rectal examinations were                            normal.                           The colon (entire examined portion) appeared normal.                           No additional abnormalities were found on                            retroflexion. Complications:            No immediate complications. Estimated blood loss:                            None. Estimated Blood Loss:  Estimated blood loss: none. Recommendation:           - Repeat colonoscopy in 10 years for screening                            purposes.                           - Patient has a contact number available for                            emergencies. The signs and symptoms of potential                            delayed complications were discussed with the                            patient. Return to normal activities tomorrow.                            Written discharge instructions were provided to the                            patient.                           - Resume previous diet.                           - Continue present medications. Gatha Mayer, MD 11/13/2015 1:29:08 PM This report has been signed electronically.

## 2015-11-13 NOTE — Patient Instructions (Addendum)
Normal colonoscopy!!  Next routine colonoscopy/screening test in 10 years - 2027  I appreciate the opportunity to care for you. Gatha Mayer, MD, FACG  YOU HAD AN ENDOSCOPIC PROCEDURE TODAY AT [facility]:   Refer to the procedure report that was given to you for any specific questions about what was found during the examination.  If the procedure report does not answer your questions, please call your gastroenterologist to clarify.  If you requested that your care partner not be given the details of your procedure findings, then the procedure report has been included in a sealed envelope for you to review at your convenience later.  YOU SHOULD EXPECT: Some feelings of bloating in the abdomen. Passage of more gas than usual.  Walking can help get rid of the air that was put into your GI tract during the procedure and reduce the bloating. If you had a lower endoscopy (such as a colonoscopy or flexible sigmoidoscopy) you may notice spotting of blood in your stool or on the toilet paper. If you underwent a bowel prep for your procedure, you may not have a normal bowel movement for a few days.  Please Note:  You might notice some irritation and congestion in your nose or some drainage.  This is from the oxygen used during your procedure.  There is no need for concern and it should clear up in a day or so.  SYMPTOMS TO REPORT IMMEDIATELY:   Following lower endoscopy (colonoscopy or flexible sigmoidoscopy):  Excessive amounts of blood in the stool  Significant tenderness or worsening of abdominal pains  Swelling of the abdomen that is new, acute  Fever of 100F or higher    For urgent or emergent issues, a gastroenterologist can be reached at any hour by calling [phone].   DIET: Your first meal following the procedure should be a small meal and then it is ok to progress to your normal diet. Heavy or fried foods are harder to digest and may make you feel  nauseous or bloated.  Likewise, meals heavy in dairy and vegetables can increase bloating.  Drink plenty of fluids but you should avoid alcoholic beverages for 24 hours.  ACTIVITY:  You should plan to take it easy for the rest of today and you should NOT DRIVE or use heavy machinery until tomorrow (because of the sedation medicines used during the test).    FOLLOW UP: Our staff will call the number listed on your records the next business day following your procedure to check on you and address any questions or concerns that you may have regarding the information given to you following your procedure. If we do not reach you, we will leave a message.  However, if you are feeling well and you are not experiencing any problems, there is no need to return our call.  We will assume that you have returned to your regular daily activities without incident.  If any biopsies were taken you will be contacted by phone or by letter within the next 1-3 weeks.  Please call us at [phone] if you have not heard about the biopsies in 3 weeks.    SIGNATURES/CONFIDENTIALITY: You and/or your care partner have signed paperwork which will be entered into your electronic medical record.  These signatures attest to the fact that that the information above on your After Visit Summary has been reviewed and is understood.  Full responsibility of the confidentiality of this discharge information lies with  you and/or your care-partner.

## 2015-11-13 NOTE — Progress Notes (Signed)
To pacu vss patent aw report to rn 

## 2015-11-14 ENCOUNTER — Ambulatory Visit
Admission: RE | Admit: 2015-11-14 | Discharge: 2015-11-14 | Disposition: A | Discharge status: Home / Self Care | Payer: Medicaid Other | Source: Ambulatory Visit

## 2015-11-14 ENCOUNTER — Other Ambulatory Visit: Payer: Self-pay | Admitting: Family Medicine

## 2015-11-14 ENCOUNTER — Telehealth: Payer: Self-pay

## 2015-11-14 DIAGNOSIS — Z1231 Encounter for screening mammogram for malignant neoplasm of breast: Secondary | ICD-10-CM

## 2015-11-14 NOTE — Telephone Encounter (Signed)
  Follow up Call-  Call back number 11/13/2015  Post procedure Call Back phone  # [phone]  Permission to leave phone message Yes    Patient was called for follow up after her procedure on 11/13/2015. No answer at the number given for follow up phone call. A message was left on the answering machine.

## 2016-01-31 ENCOUNTER — Encounter (HOSPITAL_COMMUNITY): Payer: Self-pay | Admitting: *Deleted

## 2016-01-31 ENCOUNTER — Emergency Department (HOSPITAL_COMMUNITY)
Admission: EM | Admit: 2016-01-31 | Discharge: 2016-02-01 | Disposition: A | Discharge status: Home / Self Care | Payer: Medicaid Other | Attending: Emergency Medicine | Admitting: Emergency Medicine

## 2016-01-31 ENCOUNTER — Emergency Department (HOSPITAL_COMMUNITY): Payer: Medicaid Other

## 2016-01-31 DIAGNOSIS — E119 Type 2 diabetes mellitus without complications: Secondary | ICD-10-CM | POA: Diagnosis not present

## 2016-01-31 DIAGNOSIS — I10 Essential (primary) hypertension: Secondary | ICD-10-CM | POA: Diagnosis not present

## 2016-01-31 DIAGNOSIS — R0789 Other chest pain: Secondary | ICD-10-CM | POA: Diagnosis not present

## 2016-01-31 DIAGNOSIS — Z7984 Long term (current) use of oral hypoglycemic drugs: Secondary | ICD-10-CM | POA: Diagnosis not present

## 2016-01-31 DIAGNOSIS — R079 Chest pain, unspecified: Secondary | ICD-10-CM | POA: Diagnosis present

## 2016-01-31 DIAGNOSIS — E1165 Type 2 diabetes mellitus with hyperglycemia: Secondary | ICD-10-CM

## 2016-01-31 LAB — BASIC METABOLIC PANEL
ANION GAP: 9 (ref 5–15)
BUN: 16 mg/dL (ref 6–20)
CALCIUM: 9.9 mg/dL (ref 8.9–10.3)
CO2: 25 mmol/L (ref 22–32)
CREATININE: 0.78 mg/dL (ref 0.44–1.00)
Chloride: 99 mmol/L — ABNORMAL LOW (ref 101–111)
GFR calc Af Amer: >60 mL/min (ref >60)
GLUCOSE: 323 mg/dL — AB (ref 65–99)
Potassium: 4.1 mmol/L (ref 3.5–5.1)
Sodium: 133 mmol/L — ABNORMAL LOW (ref 135–145)

## 2016-01-31 LAB — CBC
HCT: 40.9 % (ref 36.0–46.0)
Hemoglobin: 14.1 g/dL (ref 12.0–15.0)
MCH: 31.5 pg (ref 26.0–34.0)
MCHC: 34.5 g/dL (ref 30.0–36.0)
MCV: 91.5 fL (ref 78.0–100.0)
PLATELETS: 228 10*3/uL (ref 150–400)
RBC: 4.47 MIL/uL (ref 3.87–5.11)
RDW: 12.5 % (ref 11.5–15.5)
WBC: 10.4 10*3/uL (ref 4.0–10.5)

## 2016-01-31 LAB — TROPONIN I

## 2016-01-31 NOTE — ED Provider Notes (Signed)
[facility] DEPT Provider Note   CSN: [id number] Arrival date & time: 01/31/16  2147  By signing my name below, I, Maud Deed. Royston Sinner, attest that this documentation has been prepared under the direction and in the presence of Isla Pence, MD.  Electronically Signed: Maud Deed. Royston Sinner, ED Scribe. 01/31/16. 11:14 PM.   History   Chief Complaint Chief Complaint  Patient presents with  . Chest Pain   The history is provided by the patient. No language interpreter was used.    HPI Comments: [name], non-smoker,  is a 50 y.o. female with a PMHx of HTN, generalized anxiety disorder, and DM who presents to the Emergency Department complaining of intermittent, unchanged R sided chest pain onset 5:00 PM this evening. Dietrich Pates is described as squeezing. No aggravating or alleviating factors at this time. Denies any other associated symptoms. OTC antacids attempted prior to arrival without any improvement. No recent fever, chills, shortness of breath, nausea, vomiting, dizziness, or diaphoresis. Pt admits she has been non-complaint with her blood pressure medications in the last 2 weeks. No known allergies to medications.  PCP: [facility]    Past Medical History:  Diagnosis Date  . Abnormal pap   . Anxiety   . Depression   . Deviated septum 1984  . Fibroids    h/o  . Groin pain    h/o  . Hypertension   . Type II diabetes mellitus [facility])     Patient Active Problem List   Diagnosis Date Noted  . GAD (generalized anxiety disorder) 05/07/2014  . Chest pain 03/29/2014  . Abnormal pap   . Type II diabetes mellitus ([place])   . Hypertension   . Anxiety   . Fibroids   . Deviated septum   . Groin pain     Past Surgical History:  Procedure Laterality Date  . CESAREAN SECTION      OB History    Gravida Para Term Preterm AB Living   2 1     1 1    SAB TAB Ectopic Multiple Live Births           1       Home Medications    Prior to Admission  medications   Medication Sig Start Date End Date Taking? Authorizing Provider  ALPRAZolam Duanne Moron) 0.5 MG tablet Take 0.5 mg by mouth at bedtime as needed.    Historical Provider, MD  Amlodipine-Olmesartan (AZOR PO) Take by mouth.    Historical Provider, MD  ATORVASTATIN CALCIUM PO Take by mouth.    Historical Provider, MD  Dulaglutide (TRULICITY [place]) Inject into the skin.    Historical Provider, MD  glucose blood test strip     Historical Provider, MD  ibuprofen (ADVIL,MOTRIN) 800 MG tablet Take 1 tablet (800 mg total) by mouth 3 (three) times daily. 06/18/15   Courteney Lyn Mackuen, MD  losartan (COZAAR) 50 MG tablet Take 50 mg by mouth daily.    Historical Provider, MD  metFORMIN (GLUMETZA) 500 MG (MOD) 24 hr tablet Take 500 mg by mouth 2 (two) times daily at 10 am and 4 pm.     Historical Provider, MD  metoprolol succinate (TOPROL-XL) 25 MG 24 hr tablet Take 25 mg by mouth daily.    Historical Provider, MD  promethazine-dextromethorphan (PROMETHAZINE-DM) 6.25-15 MG/5ML syrup Take by mouth. 11/07/15   Historical Provider, MD    Family History Family History  Problem Relation Age of Onset  . Scoliosis Brother   . Anxiety disorder Brother   .  Depression Brother   . Angina Maternal Grandmother   . Hypertension Maternal Grandmother   . Anxiety disorder Maternal Grandmother   . Depression Maternal Grandmother   . Atrial fibrillation Father   . Hypertension Father   . Stroke Father   . Bipolar disorder Father   . Colon cancer Maternal Grandfather   . Anxiety disorder Maternal Grandfather   . Depression Maternal Grandfather   . Depression Mother   . Anxiety disorder Mother   . Suicidality Paternal Grandfather     Social History Social History  Substance Use Topics  . Smoking status: Never Smoker  . Smokeless tobacco: Never Used  . Alcohol use 6.0 - 7.2 oz/week    10 - 12 Glasses of wine per week     Comment: 10-12 glasses of wine per week     Allergies   Review of patient's  allergies indicates no known allergies.   Review of Systems Review of Systems  Constitutional: Negative for chills and fever.  Respiratory: Negative for shortness of breath.   Cardiovascular: Positive for chest pain.  Gastrointestinal: Negative for nausea and vomiting.  Neurological: Negative for dizziness.  All other systems reviewed and are negative.    Physical Exam Updated Vital Signs BP (!) 174/102   Pulse 60   Temp 98.2 F (36.8 C) (Oral)   Resp 22   Ht 5' (1.524 m)   Wt 178 lb 3 oz (80.8 kg)   LMP 12/31/2015   SpO2 98%   BMI 34.80 kg/m   Physical Exam  Constitutional: She is oriented to person, place, and time. She appears well-developed and well-nourished. No distress.  HENT:  Head: Normocephalic and atraumatic.  Eyes: EOM are normal.  Neck: Normal range of motion.  Cardiovascular: Normal rate, regular rhythm and normal heart sounds.   Pulmonary/Chest: Effort normal and breath sounds normal.  Abdominal: Soft. She exhibits no distension. There is no tenderness.  Musculoskeletal: Normal range of motion.  Neurological: She is alert and oriented to person, place, and time.  Skin: Skin is warm and dry.  Psychiatric: She has a normal mood and affect. Judgment normal.  Nursing note and vitals reviewed.    ED Treatments / Results   DIAGNOSTIC STUDIES: Oxygen Saturation is 98% on RA, Normal by my interpretation.    COORDINATION OF CARE: 11:05 PM- Will order blood work, EKG,  and CXR. Discussed treatment plan with pt at bedside and pt agreed to plan.     Labs (all labs ordered are listed, but only abnormal results are displayed) Labs Reviewed  BASIC METABOLIC PANEL - Abnormal; Notable for the following:       Result Value   Sodium 133 (*)    Chloride 99 (*)    Glucose, Bld 323 (*)    All other components within normal limits  CBC  TROPONIN I  TROPONIN I  D-DIMER, QUANTITATIVE (NOT AT [facility])    EKG  EKG Interpretation  Date/Time:  Saturday January 31 2016 22:02:23 EDT Ventricular Rate:  71 PR Interval:  162 QRS Duration: 90 QT Interval:  388 QTC Calculation: 421 R Axis:   69 Text Interpretation:  Normal sinus rhythm Cannot rule out Anterior infarct , age undetermined Abnormal ECG Confirmed by [facility] MD, Gearldine Looney ([id number]) on 01/31/2016 11:05:36 PM       Radiology Dg Chest 2 View  Result Date: 01/31/2016 CLINICAL DATA:  Chest pain today EXAM: CHEST  2 VIEW COMPARISON:  None. FINDINGS: Normal heart size and mediastinal contours. No  acute infiltrate or edema. No effusion or pneumothorax. No acute osseous findings. IMPRESSION: Negative chest. Electronically Signed   By: Monte Fantasia M.D.   On: 01/31/2016 22:21    Procedures Procedures (including critical care time)  Medications Ordered in ED Medications - No data to display   Initial Impression / Assessment and Plan / ED Course  I have reviewed the triage vital signs and the nursing notes.  Pertinent labs & imaging results that were available during my care of the patient were reviewed by me and considered in my medical decision making (see chart for details).  Clinical Course   I talked with pt regarding taking her meds for htn and for dm on a regular basis.  Pt's cp is atypical and with 2 sets of negative troponins and a recent stress test, pt can be d/c'd home.  She knows to return if worse.  Final Clinical Impressions(s) / ED Diagnoses   Final diagnoses:  Essential hypertension  Poorly controlled type 2 diabetes mellitus ([place])  Nonspecific chest pain    New Prescriptions New Prescriptions   No medications on file   I personally performed the services described in this documentation, which was scribed in my presence. The recorded information has been reviewed and is accurate.    Isla Pence, MD 02/01/16 [phone]

## 2016-01-31 NOTE — ED Triage Notes (Signed)
The pt is c/o some pain   In her chest central since 1630  No sob no dizziness or sob.  She has taken antiacids that have not helped the pain either

## 2016-02-01 LAB — TROPONIN I: Troponin I: <0.03 ng/mL (ref <0.03)

## 2016-02-01 LAB — D-DIMER, QUANTITATIVE (NOT AT ARMC): D DIMER QUANT: 0.38 ug{FEU}/mL (ref 0.00–0.50)

## 2016-02-01 NOTE — ED Notes (Signed)
Pt provided with d/c instructions at this time. Pt verbalizes understanding of d/c instructions as well as follow up procedure after d/c. No new RX at time of d/c.  Pt in no apparent distress at this time. Pt ambulatory at time of d/c.   

## 2017-06-30 DIAGNOSIS — E119 Type 2 diabetes mellitus without complications: Secondary | ICD-10-CM | POA: Diagnosis not present

## 2017-06-30 DIAGNOSIS — E782 Mixed hyperlipidemia: Secondary | ICD-10-CM | POA: Diagnosis not present

## 2017-06-30 DIAGNOSIS — I10 Essential (primary) hypertension: Secondary | ICD-10-CM | POA: Diagnosis not present

## 2017-07-12 DIAGNOSIS — E119 Type 2 diabetes mellitus without complications: Secondary | ICD-10-CM | POA: Diagnosis not present

## 2017-07-12 DIAGNOSIS — H2513 Age-related nuclear cataract, bilateral: Secondary | ICD-10-CM | POA: Diagnosis not present

## 2017-07-12 DIAGNOSIS — H35033 Hypertensive retinopathy, bilateral: Secondary | ICD-10-CM | POA: Diagnosis not present

## 2017-07-12 DIAGNOSIS — H25013 Cortical age-related cataract, bilateral: Secondary | ICD-10-CM | POA: Diagnosis not present

## 2017-09-05 DIAGNOSIS — E119 Type 2 diabetes mellitus without complications: Secondary | ICD-10-CM | POA: Diagnosis not present

## 2017-09-05 DIAGNOSIS — E7800 Pure hypercholesterolemia, unspecified: Secondary | ICD-10-CM | POA: Diagnosis not present

## 2017-09-05 DIAGNOSIS — I10 Essential (primary) hypertension: Secondary | ICD-10-CM | POA: Diagnosis not present

## 2017-09-05 DIAGNOSIS — R0683 Snoring: Secondary | ICD-10-CM | POA: Diagnosis not present

## 2017-09-14 DIAGNOSIS — E7800 Pure hypercholesterolemia, unspecified: Secondary | ICD-10-CM | POA: Diagnosis not present

## 2017-09-14 DIAGNOSIS — E119 Type 2 diabetes mellitus without complications: Secondary | ICD-10-CM | POA: Diagnosis not present

## 2017-09-14 DIAGNOSIS — R5383 Other fatigue: Secondary | ICD-10-CM | POA: Diagnosis not present

## 2017-09-14 DIAGNOSIS — I10 Essential (primary) hypertension: Secondary | ICD-10-CM | POA: Diagnosis not present

## 2017-09-14 DIAGNOSIS — E559 Vitamin D deficiency, unspecified: Secondary | ICD-10-CM | POA: Diagnosis not present

## 2017-09-21 DIAGNOSIS — Z0000 Encounter for general adult medical examination without abnormal findings: Secondary | ICD-10-CM | POA: Diagnosis not present

## 2017-09-21 DIAGNOSIS — R0683 Snoring: Secondary | ICD-10-CM | POA: Diagnosis not present

## 2017-09-21 DIAGNOSIS — E119 Type 2 diabetes mellitus without complications: Secondary | ICD-10-CM | POA: Diagnosis not present

## 2017-09-21 DIAGNOSIS — I10 Essential (primary) hypertension: Secondary | ICD-10-CM | POA: Diagnosis not present

## 2017-11-02 DIAGNOSIS — M25562 Pain in left knee: Secondary | ICD-10-CM | POA: Diagnosis not present

## 2017-11-02 DIAGNOSIS — M1712 Unilateral primary osteoarthritis, left knee: Secondary | ICD-10-CM | POA: Diagnosis not present

## 2017-11-03 ENCOUNTER — Other Ambulatory Visit: Payer: Self-pay | Admitting: Orthopedic Surgery

## 2017-11-03 DIAGNOSIS — M25562 Pain in left knee: Secondary | ICD-10-CM

## 2017-11-05 DIAGNOSIS — M25562 Pain in left knee: Secondary | ICD-10-CM | POA: Diagnosis not present

## 2017-11-09 ENCOUNTER — Inpatient Hospital Stay
Admission: RE | Admit: 2017-11-09 | Discharge: 2017-11-09 | Disposition: A | Discharge status: Home / Self Care | Payer: Self-pay | Source: Ambulatory Visit | Attending: Orthopedic Surgery | Admitting: Orthopedic Surgery

## 2017-11-11 DIAGNOSIS — M1712 Unilateral primary osteoarthritis, left knee: Secondary | ICD-10-CM | POA: Diagnosis not present

## 2017-11-11 DIAGNOSIS — M25562 Pain in left knee: Secondary | ICD-10-CM | POA: Diagnosis not present

## 2017-11-22 DIAGNOSIS — M1712 Unilateral primary osteoarthritis, left knee: Secondary | ICD-10-CM | POA: Diagnosis not present

## 2017-11-24 DIAGNOSIS — F4323 Adjustment disorder with mixed anxiety and depressed mood: Secondary | ICD-10-CM | POA: Diagnosis not present

## 2017-12-20 DIAGNOSIS — F4323 Adjustment disorder with mixed anxiety and depressed mood: Secondary | ICD-10-CM | POA: Diagnosis not present

## 2017-12-23 DIAGNOSIS — E119 Type 2 diabetes mellitus without complications: Secondary | ICD-10-CM | POA: Diagnosis not present

## 2017-12-23 DIAGNOSIS — E7800 Pure hypercholesterolemia, unspecified: Secondary | ICD-10-CM | POA: Diagnosis not present

## 2017-12-29 DIAGNOSIS — F339 Major depressive disorder, recurrent, unspecified: Secondary | ICD-10-CM | POA: Diagnosis not present

## 2017-12-29 DIAGNOSIS — I10 Essential (primary) hypertension: Secondary | ICD-10-CM | POA: Diagnosis not present

## 2017-12-29 DIAGNOSIS — E119 Type 2 diabetes mellitus without complications: Secondary | ICD-10-CM | POA: Diagnosis not present

## 2018-01-09 DIAGNOSIS — N939 Abnormal uterine and vaginal bleeding, unspecified: Secondary | ICD-10-CM | POA: Diagnosis not present

## 2018-01-09 DIAGNOSIS — Z1231 Encounter for screening mammogram for malignant neoplasm of breast: Secondary | ICD-10-CM | POA: Diagnosis not present

## 2018-01-09 DIAGNOSIS — Z124 Encounter for screening for malignant neoplasm of cervix: Secondary | ICD-10-CM | POA: Diagnosis not present

## 2018-01-09 DIAGNOSIS — Z01419 Encounter for gynecological examination (general) (routine) without abnormal findings: Secondary | ICD-10-CM | POA: Diagnosis not present

## 2018-01-09 DIAGNOSIS — Z6838 Body mass index (BMI) 38.0-38.9, adult: Secondary | ICD-10-CM | POA: Diagnosis not present

## 2018-01-09 DIAGNOSIS — N898 Other specified noninflammatory disorders of vagina: Secondary | ICD-10-CM | POA: Diagnosis not present

## 2018-01-10 DIAGNOSIS — F4323 Adjustment disorder with mixed anxiety and depressed mood: Secondary | ICD-10-CM | POA: Diagnosis not present

## 2018-02-21 DIAGNOSIS — N926 Irregular menstruation, unspecified: Secondary | ICD-10-CM | POA: Diagnosis not present

## 2018-02-21 DIAGNOSIS — N921 Excessive and frequent menstruation with irregular cycle: Secondary | ICD-10-CM | POA: Diagnosis not present

## 2018-03-30 DIAGNOSIS — E559 Vitamin D deficiency, unspecified: Secondary | ICD-10-CM | POA: Diagnosis not present

## 2018-03-30 DIAGNOSIS — R5383 Other fatigue: Secondary | ICD-10-CM | POA: Diagnosis not present

## 2018-03-30 DIAGNOSIS — E119 Type 2 diabetes mellitus without complications: Secondary | ICD-10-CM | POA: Diagnosis not present

## 2018-03-30 DIAGNOSIS — I10 Essential (primary) hypertension: Secondary | ICD-10-CM | POA: Diagnosis not present

## 2018-04-05 DIAGNOSIS — I10 Essential (primary) hypertension: Secondary | ICD-10-CM | POA: Diagnosis not present

## 2018-04-05 DIAGNOSIS — F339 Major depressive disorder, recurrent, unspecified: Secondary | ICD-10-CM | POA: Diagnosis not present

## 2018-04-05 DIAGNOSIS — E119 Type 2 diabetes mellitus without complications: Secondary | ICD-10-CM | POA: Diagnosis not present

## 2018-04-05 DIAGNOSIS — E7800 Pure hypercholesterolemia, unspecified: Secondary | ICD-10-CM | POA: Diagnosis not present

## 2018-05-03 DIAGNOSIS — I10 Essential (primary) hypertension: Secondary | ICD-10-CM | POA: Diagnosis not present

## 2018-05-03 DIAGNOSIS — E1165 Type 2 diabetes mellitus with hyperglycemia: Secondary | ICD-10-CM | POA: Diagnosis not present

## 2018-05-03 DIAGNOSIS — E118 Type 2 diabetes mellitus with unspecified complications: Secondary | ICD-10-CM | POA: Diagnosis not present

## 2018-05-03 DIAGNOSIS — E7800 Pure hypercholesterolemia, unspecified: Secondary | ICD-10-CM | POA: Diagnosis not present

## 2018-07-19 DIAGNOSIS — E118 Type 2 diabetes mellitus with unspecified complications: Secondary | ICD-10-CM | POA: Diagnosis not present

## 2018-07-19 DIAGNOSIS — I10 Essential (primary) hypertension: Secondary | ICD-10-CM | POA: Diagnosis not present

## 2018-07-19 DIAGNOSIS — E1165 Type 2 diabetes mellitus with hyperglycemia: Secondary | ICD-10-CM | POA: Diagnosis not present

## 2018-07-19 DIAGNOSIS — E119 Type 2 diabetes mellitus without complications: Secondary | ICD-10-CM | POA: Diagnosis not present

## 2018-08-02 DIAGNOSIS — E113393 Type 2 diabetes mellitus with moderate nonproliferative diabetic retinopathy without macular edema, bilateral: Secondary | ICD-10-CM | POA: Diagnosis not present

## 2018-08-02 DIAGNOSIS — H524 Presbyopia: Secondary | ICD-10-CM | POA: Diagnosis not present

## 2018-08-02 DIAGNOSIS — H5213 Myopia, bilateral: Secondary | ICD-10-CM | POA: Diagnosis not present

## 2018-08-02 DIAGNOSIS — H52203 Unspecified astigmatism, bilateral: Secondary | ICD-10-CM | POA: Diagnosis not present

## 2018-08-30 DIAGNOSIS — I10 Essential (primary) hypertension: Secondary | ICD-10-CM | POA: Diagnosis not present

## 2018-08-30 DIAGNOSIS — E118 Type 2 diabetes mellitus with unspecified complications: Secondary | ICD-10-CM | POA: Diagnosis not present

## 2018-08-30 DIAGNOSIS — E119 Type 2 diabetes mellitus without complications: Secondary | ICD-10-CM | POA: Diagnosis not present

## 2018-08-30 DIAGNOSIS — E1165 Type 2 diabetes mellitus with hyperglycemia: Secondary | ICD-10-CM | POA: Diagnosis not present

## 2018-10-04 DIAGNOSIS — I10 Essential (primary) hypertension: Secondary | ICD-10-CM | POA: Diagnosis not present

## 2018-10-04 DIAGNOSIS — E1165 Type 2 diabetes mellitus with hyperglycemia: Secondary | ICD-10-CM | POA: Diagnosis not present

## 2018-10-04 DIAGNOSIS — E7800 Pure hypercholesterolemia, unspecified: Secondary | ICD-10-CM | POA: Diagnosis not present

## 2018-10-10 DIAGNOSIS — Z0000 Encounter for general adult medical examination without abnormal findings: Secondary | ICD-10-CM | POA: Diagnosis not present

## 2018-10-10 DIAGNOSIS — F339 Major depressive disorder, recurrent, unspecified: Secondary | ICD-10-CM | POA: Diagnosis not present

## 2018-10-10 DIAGNOSIS — E1165 Type 2 diabetes mellitus with hyperglycemia: Secondary | ICD-10-CM | POA: Diagnosis not present

## 2018-10-10 DIAGNOSIS — E559 Vitamin D deficiency, unspecified: Secondary | ICD-10-CM | POA: Diagnosis not present

## 2018-10-10 DIAGNOSIS — F419 Anxiety disorder, unspecified: Secondary | ICD-10-CM | POA: Diagnosis not present

## 2018-11-01 DIAGNOSIS — E08319 Diabetes mellitus due to underlying condition with unspecified diabetic retinopathy without macular edema: Secondary | ICD-10-CM | POA: Diagnosis not present

## 2018-11-01 DIAGNOSIS — E118 Type 2 diabetes mellitus with unspecified complications: Secondary | ICD-10-CM | POA: Diagnosis not present

## 2018-11-01 DIAGNOSIS — R809 Proteinuria, unspecified: Secondary | ICD-10-CM | POA: Diagnosis not present

## 2018-11-01 DIAGNOSIS — I10 Essential (primary) hypertension: Secondary | ICD-10-CM | POA: Diagnosis not present

## 2019-02-02 DIAGNOSIS — E118 Type 2 diabetes mellitus with unspecified complications: Secondary | ICD-10-CM | POA: Diagnosis not present

## 2019-02-08 DIAGNOSIS — R809 Proteinuria, unspecified: Secondary | ICD-10-CM | POA: Diagnosis not present

## 2019-02-08 DIAGNOSIS — E08319 Diabetes mellitus due to underlying condition with unspecified diabetic retinopathy without macular edema: Secondary | ICD-10-CM | POA: Diagnosis not present

## 2019-02-08 DIAGNOSIS — E118 Type 2 diabetes mellitus with unspecified complications: Secondary | ICD-10-CM | POA: Diagnosis not present

## 2019-02-08 DIAGNOSIS — I10 Essential (primary) hypertension: Secondary | ICD-10-CM | POA: Diagnosis not present

## 2019-03-28 DIAGNOSIS — D2261 Melanocytic nevi of right upper limb, including shoulder: Secondary | ICD-10-CM | POA: Diagnosis not present

## 2019-03-28 DIAGNOSIS — D225 Melanocytic nevi of trunk: Secondary | ICD-10-CM | POA: Diagnosis not present

## 2019-03-28 DIAGNOSIS — L821 Other seborrheic keratosis: Secondary | ICD-10-CM | POA: Diagnosis not present

## 2019-03-28 DIAGNOSIS — D2361 Other benign neoplasm of skin of right upper limb, including shoulder: Secondary | ICD-10-CM | POA: Diagnosis not present

## 2019-04-04 DIAGNOSIS — I10 Essential (primary) hypertension: Secondary | ICD-10-CM | POA: Diagnosis not present

## 2019-04-04 DIAGNOSIS — E119 Type 2 diabetes mellitus without complications: Secondary | ICD-10-CM | POA: Diagnosis not present

## 2019-04-04 DIAGNOSIS — E7800 Pure hypercholesterolemia, unspecified: Secondary | ICD-10-CM | POA: Diagnosis not present

## 2019-04-04 DIAGNOSIS — Z0000 Encounter for general adult medical examination without abnormal findings: Secondary | ICD-10-CM | POA: Diagnosis not present

## 2019-04-11 DIAGNOSIS — I10 Essential (primary) hypertension: Secondary | ICD-10-CM | POA: Diagnosis not present

## 2019-04-11 DIAGNOSIS — E1165 Type 2 diabetes mellitus with hyperglycemia: Secondary | ICD-10-CM | POA: Diagnosis not present

## 2019-08-16 ENCOUNTER — Ambulatory Visit: Payer: BC Managed Care – PPO | Attending: Internal Medicine

## 2019-08-16 DIAGNOSIS — Z23 Encounter for immunization: Secondary | ICD-10-CM | POA: Insufficient documentation

## 2019-08-16 NOTE — Progress Notes (Signed)
   Covid-19 Vaccination Clinic  Name:  [name]    MRN: [id number] DOB: 1965/12/05  08/16/2019  [name] was observed post Covid-19 immunization for 15 minutes without incident. She was provided with Vaccine Information Sheet and instruction to access the V-Safe system.   [name] was instructed to call 911 with any severe reactions post vaccine: Marland Kitchen Difficulty breathing  . Swelling of face and throat  . A fast heartbeat  . A bad rash all over body  . Dizziness and weakness   Immunizations Administered    Name Date Dose VIS Date Route   Pfizer COVID-19 Vaccine 08/16/2019  3:45 PM 0.3 mL 05/25/2019 Intramuscular   Manufacturer: [place]   Lot: [id number]   [place]: [id number]

## 2019-09-12 ENCOUNTER — Ambulatory Visit: Payer: Self-pay

## 2019-09-17 ENCOUNTER — Ambulatory Visit: Payer: Self-pay

## 2019-09-18 ENCOUNTER — Ambulatory Visit: Payer: Self-pay

## 2019-09-20 ENCOUNTER — Ambulatory Visit: Payer: BC Managed Care – PPO | Attending: Internal Medicine

## 2019-09-20 DIAGNOSIS — Z23 Encounter for immunization: Secondary | ICD-10-CM

## 2019-09-20 NOTE — Progress Notes (Signed)
   Covid-19 Vaccination Clinic  Name:  [name]    MRN: [id number] DOB: [birth year]  09/20/2019  [name] was observed post Covid-19 immunization for 15 minutes without incident. She was provided with Vaccine Information Sheet and instruction to access the V-Safe system.   Ms. School was instructed to call 911 with any severe reactions post vaccine: Marland Kitchen Difficulty breathing  . Swelling of face and throat  . A fast heartbeat  . A bad rash all over body  . Dizziness and weakness   Immunizations Administered    Name Date Dose VIS Date Route   Pfizer COVID-19 Vaccine 09/20/2019 12:14 PM 0.3 mL 05/25/2019 Intramuscular   Manufacturer: [employer]   Lot: [id number]   [place]: [id number]

## 2019-09-26 DIAGNOSIS — N898 Other specified noninflammatory disorders of vagina: Secondary | ICD-10-CM | POA: Diagnosis not present

## 2019-09-26 DIAGNOSIS — Z01419 Encounter for gynecological examination (general) (routine) without abnormal findings: Secondary | ICD-10-CM | POA: Diagnosis not present

## 2019-09-28 DIAGNOSIS — E113393 Type 2 diabetes mellitus with moderate nonproliferative diabetic retinopathy without macular edema, bilateral: Secondary | ICD-10-CM | POA: Diagnosis not present

## 2019-09-28 DIAGNOSIS — H52203 Unspecified astigmatism, bilateral: Secondary | ICD-10-CM | POA: Diagnosis not present

## 2019-09-28 DIAGNOSIS — H524 Presbyopia: Secondary | ICD-10-CM | POA: Diagnosis not present

## 2019-09-28 DIAGNOSIS — H5213 Myopia, bilateral: Secondary | ICD-10-CM | POA: Diagnosis not present

## 2019-10-04 DIAGNOSIS — I10 Essential (primary) hypertension: Secondary | ICD-10-CM | POA: Diagnosis not present

## 2019-10-04 DIAGNOSIS — E1165 Type 2 diabetes mellitus with hyperglycemia: Secondary | ICD-10-CM | POA: Diagnosis not present

## 2019-10-04 DIAGNOSIS — E559 Vitamin D deficiency, unspecified: Secondary | ICD-10-CM | POA: Diagnosis not present

## 2019-10-19 DIAGNOSIS — Z0000 Encounter for general adult medical examination without abnormal findings: Secondary | ICD-10-CM | POA: Diagnosis not present

## 2019-10-19 DIAGNOSIS — E1165 Type 2 diabetes mellitus with hyperglycemia: Secondary | ICD-10-CM | POA: Diagnosis not present

## 2019-11-14 DIAGNOSIS — Z1231 Encounter for screening mammogram for malignant neoplasm of breast: Secondary | ICD-10-CM | POA: Diagnosis not present

## 2019-12-05 DIAGNOSIS — J069 Acute upper respiratory infection, unspecified: Secondary | ICD-10-CM | POA: Diagnosis not present

## 2020-01-22 DIAGNOSIS — M19072 Primary osteoarthritis, left ankle and foot: Secondary | ICD-10-CM | POA: Diagnosis not present

## 2020-01-22 DIAGNOSIS — M25741 Osteophyte, right hand: Secondary | ICD-10-CM | POA: Diagnosis not present

## 2020-01-22 DIAGNOSIS — M79642 Pain in left hand: Secondary | ICD-10-CM | POA: Diagnosis not present

## 2020-01-22 DIAGNOSIS — M5136 Other intervertebral disc degeneration, lumbar region: Secondary | ICD-10-CM | POA: Diagnosis not present

## 2020-01-22 DIAGNOSIS — M1990 Unspecified osteoarthritis, unspecified site: Secondary | ICD-10-CM | POA: Diagnosis not present

## 2020-01-22 DIAGNOSIS — M064 Inflammatory polyarthropathy: Secondary | ICD-10-CM | POA: Diagnosis not present

## 2020-01-22 DIAGNOSIS — M25561 Pain in right knee: Secondary | ICD-10-CM | POA: Diagnosis not present

## 2020-01-22 DIAGNOSIS — M2550 Pain in unspecified joint: Secondary | ICD-10-CM | POA: Diagnosis not present

## 2020-01-22 DIAGNOSIS — M79671 Pain in right foot: Secondary | ICD-10-CM | POA: Diagnosis not present

## 2020-01-22 DIAGNOSIS — M1711 Unilateral primary osteoarthritis, right knee: Secondary | ICD-10-CM | POA: Diagnosis not present

## 2020-01-22 DIAGNOSIS — M1712 Unilateral primary osteoarthritis, left knee: Secondary | ICD-10-CM | POA: Diagnosis not present

## 2020-01-22 DIAGNOSIS — M549 Dorsalgia, unspecified: Secondary | ICD-10-CM | POA: Diagnosis not present

## 2020-01-22 DIAGNOSIS — M47818 Spondylosis without myelopathy or radiculopathy, sacral and sacrococcygeal region: Secondary | ICD-10-CM | POA: Diagnosis not present

## 2020-01-22 DIAGNOSIS — M533 Sacrococcygeal disorders, not elsewhere classified: Secondary | ICD-10-CM | POA: Diagnosis not present

## 2020-01-22 DIAGNOSIS — M47817 Spondylosis without myelopathy or radiculopathy, lumbosacral region: Secondary | ICD-10-CM | POA: Diagnosis not present

## 2020-01-22 DIAGNOSIS — M19031 Primary osteoarthritis, right wrist: Secondary | ICD-10-CM | POA: Diagnosis not present

## 2020-01-22 DIAGNOSIS — M25562 Pain in left knee: Secondary | ICD-10-CM | POA: Diagnosis not present

## 2020-01-22 DIAGNOSIS — M79672 Pain in left foot: Secondary | ICD-10-CM | POA: Diagnosis not present

## 2020-01-22 DIAGNOSIS — M19041 Primary osteoarthritis, right hand: Secondary | ICD-10-CM | POA: Diagnosis not present

## 2020-01-22 DIAGNOSIS — M19071 Primary osteoarthritis, right ankle and foot: Secondary | ICD-10-CM | POA: Diagnosis not present

## 2020-01-22 DIAGNOSIS — M79641 Pain in right hand: Secondary | ICD-10-CM | POA: Diagnosis not present

## 2020-01-22 DIAGNOSIS — M25761 Osteophyte, right knee: Secondary | ICD-10-CM | POA: Diagnosis not present

## 2020-01-22 DIAGNOSIS — M19042 Primary osteoarthritis, left hand: Secondary | ICD-10-CM | POA: Diagnosis not present

## 2020-01-22 DIAGNOSIS — M160 Bilateral primary osteoarthritis of hip: Secondary | ICD-10-CM | POA: Diagnosis not present

## 2020-01-28 DIAGNOSIS — M2550 Pain in unspecified joint: Secondary | ICD-10-CM | POA: Diagnosis not present

## 2020-02-05 DIAGNOSIS — R300 Dysuria: Secondary | ICD-10-CM | POA: Diagnosis not present

## 2020-02-05 DIAGNOSIS — I10 Essential (primary) hypertension: Secondary | ICD-10-CM | POA: Diagnosis not present

## 2020-02-05 DIAGNOSIS — E1165 Type 2 diabetes mellitus with hyperglycemia: Secondary | ICD-10-CM | POA: Diagnosis not present

## 2020-02-07 DIAGNOSIS — M2550 Pain in unspecified joint: Secondary | ICD-10-CM | POA: Diagnosis not present

## 2020-02-07 DIAGNOSIS — M064 Inflammatory polyarthropathy: Secondary | ICD-10-CM | POA: Diagnosis not present

## 2020-02-07 DIAGNOSIS — M25562 Pain in left knee: Secondary | ICD-10-CM | POA: Diagnosis not present

## 2020-02-07 DIAGNOSIS — M1990 Unspecified osteoarthritis, unspecified site: Secondary | ICD-10-CM | POA: Diagnosis not present

## 2020-02-12 DIAGNOSIS — E08319 Diabetes mellitus due to underlying condition with unspecified diabetic retinopathy without macular edema: Secondary | ICD-10-CM | POA: Diagnosis not present

## 2020-02-12 DIAGNOSIS — E7800 Pure hypercholesterolemia, unspecified: Secondary | ICD-10-CM | POA: Diagnosis not present

## 2020-02-12 DIAGNOSIS — E119 Type 2 diabetes mellitus without complications: Secondary | ICD-10-CM | POA: Diagnosis not present

## 2020-02-12 DIAGNOSIS — R809 Proteinuria, unspecified: Secondary | ICD-10-CM | POA: Diagnosis not present

## 2020-03-27 DIAGNOSIS — D225 Melanocytic nevi of trunk: Secondary | ICD-10-CM | POA: Diagnosis not present

## 2020-03-27 DIAGNOSIS — D2262 Melanocytic nevi of left upper limb, including shoulder: Secondary | ICD-10-CM | POA: Diagnosis not present

## 2020-03-27 DIAGNOSIS — L821 Other seborrheic keratosis: Secondary | ICD-10-CM | POA: Diagnosis not present

## 2020-03-27 DIAGNOSIS — D2261 Melanocytic nevi of right upper limb, including shoulder: Secondary | ICD-10-CM | POA: Diagnosis not present

## 2020-04-21 ENCOUNTER — Ambulatory Visit: Payer: BC Managed Care – PPO

## 2020-04-24 ENCOUNTER — Ambulatory Visit: Payer: BC Managed Care – PPO | Attending: Internal Medicine

## 2020-04-24 DIAGNOSIS — Z23 Encounter for immunization: Secondary | ICD-10-CM

## 2020-04-24 NOTE — Progress Notes (Signed)
   Covid-19 Vaccination Clinic  Name:  [name]    MRN: [id number] DOB: 08/05/1965  04/24/2020  [name] was observed post Covid-19 immunization for 15 minutes without incident. She was provided with Vaccine Information Sheet and instruction to access the V-Safe system.   [name] was instructed to call 911 with any severe reactions post vaccine: Marland Kitchen Difficulty breathing  . Swelling of face and throat  . A fast heartbeat  . A bad rash all over body  . Dizziness and weakness

## 2020-05-31 DIAGNOSIS — J0100 Acute maxillary sinusitis, unspecified: Secondary | ICD-10-CM | POA: Diagnosis not present

## 2020-05-31 DIAGNOSIS — J3489 Other specified disorders of nose and nasal sinuses: Secondary | ICD-10-CM | POA: Diagnosis not present

## 2020-05-31 DIAGNOSIS — R0981 Nasal congestion: Secondary | ICD-10-CM | POA: Diagnosis not present

## 2020-05-31 DIAGNOSIS — R059 Cough, unspecified: Secondary | ICD-10-CM | POA: Diagnosis not present

## 2020-06-09 DIAGNOSIS — J0101 Acute recurrent maxillary sinusitis: Secondary | ICD-10-CM | POA: Diagnosis not present

## 2020-06-09 DIAGNOSIS — J40 Bronchitis, not specified as acute or chronic: Secondary | ICD-10-CM | POA: Diagnosis not present

## 2020-06-21 DIAGNOSIS — Z1152 Encounter for screening for COVID-19: Secondary | ICD-10-CM | POA: Diagnosis not present

## 2020-07-03 DIAGNOSIS — Z1152 Encounter for screening for COVID-19: Secondary | ICD-10-CM | POA: Diagnosis not present

## 2020-07-11 DIAGNOSIS — Z1152 Encounter for screening for COVID-19: Secondary | ICD-10-CM | POA: Diagnosis not present

## 2020-10-15 DIAGNOSIS — Z0000 Encounter for general adult medical examination without abnormal findings: Secondary | ICD-10-CM | POA: Diagnosis not present

## 2020-10-16 DIAGNOSIS — E7801 Familial hypercholesterolemia: Secondary | ICD-10-CM | POA: Diagnosis not present

## 2020-10-16 DIAGNOSIS — E118 Type 2 diabetes mellitus with unspecified complications: Secondary | ICD-10-CM | POA: Diagnosis not present

## 2020-10-22 DIAGNOSIS — E1169 Type 2 diabetes mellitus with other specified complication: Secondary | ICD-10-CM | POA: Diagnosis not present

## 2020-10-22 DIAGNOSIS — I10 Essential (primary) hypertension: Secondary | ICD-10-CM | POA: Diagnosis not present

## 2020-10-22 DIAGNOSIS — E7800 Pure hypercholesterolemia, unspecified: Secondary | ICD-10-CM | POA: Diagnosis not present

## 2020-10-22 DIAGNOSIS — R809 Proteinuria, unspecified: Secondary | ICD-10-CM | POA: Diagnosis not present

## 2020-10-23 DIAGNOSIS — Z0000 Encounter for general adult medical examination without abnormal findings: Secondary | ICD-10-CM | POA: Diagnosis not present

## 2020-10-23 DIAGNOSIS — I10 Essential (primary) hypertension: Secondary | ICD-10-CM | POA: Diagnosis not present

## 2020-10-23 DIAGNOSIS — J00 Acute nasopharyngitis [common cold]: Secondary | ICD-10-CM | POA: Diagnosis not present

## 2020-10-23 DIAGNOSIS — R809 Proteinuria, unspecified: Secondary | ICD-10-CM | POA: Diagnosis not present

## 2020-11-06 DIAGNOSIS — I10 Essential (primary) hypertension: Secondary | ICD-10-CM | POA: Diagnosis not present

## 2020-12-09 DIAGNOSIS — M1990 Unspecified osteoarthritis, unspecified site: Secondary | ICD-10-CM | POA: Diagnosis not present

## 2020-12-09 DIAGNOSIS — E119 Type 2 diabetes mellitus without complications: Secondary | ICD-10-CM | POA: Diagnosis not present

## 2020-12-09 DIAGNOSIS — M79643 Pain in unspecified hand: Secondary | ICD-10-CM | POA: Diagnosis not present

## 2020-12-09 DIAGNOSIS — M064 Inflammatory polyarthropathy: Secondary | ICD-10-CM | POA: Diagnosis not present

## 2020-12-30 DIAGNOSIS — E118 Type 2 diabetes mellitus with unspecified complications: Secondary | ICD-10-CM | POA: Diagnosis not present

## 2020-12-30 DIAGNOSIS — F411 Generalized anxiety disorder: Secondary | ICD-10-CM | POA: Diagnosis not present

## 2020-12-30 DIAGNOSIS — I10 Essential (primary) hypertension: Secondary | ICD-10-CM | POA: Diagnosis not present

## 2020-12-31 ENCOUNTER — Other Ambulatory Visit: Payer: Self-pay | Admitting: Internal Medicine

## 2020-12-31 DIAGNOSIS — E118 Type 2 diabetes mellitus with unspecified complications: Secondary | ICD-10-CM

## 2021-02-02 ENCOUNTER — Inpatient Hospital Stay: Admission: RE | Admit: 2021-02-02 | Payer: BC Managed Care – PPO | Source: Ambulatory Visit

## 2021-02-02 DIAGNOSIS — Z20822 Contact with and (suspected) exposure to covid-19: Secondary | ICD-10-CM | POA: Diagnosis not present

## 2021-02-02 DIAGNOSIS — R059 Cough, unspecified: Secondary | ICD-10-CM | POA: Diagnosis not present

## 2021-02-02 DIAGNOSIS — J069 Acute upper respiratory infection, unspecified: Secondary | ICD-10-CM | POA: Diagnosis not present

## 2021-02-06 DIAGNOSIS — R062 Wheezing: Secondary | ICD-10-CM | POA: Diagnosis not present

## 2021-02-06 DIAGNOSIS — R0981 Nasal congestion: Secondary | ICD-10-CM | POA: Diagnosis not present

## 2021-02-06 DIAGNOSIS — R5383 Other fatigue: Secondary | ICD-10-CM | POA: Diagnosis not present

## 2021-02-19 ENCOUNTER — Other Ambulatory Visit: Payer: BC Managed Care – PPO

## 2021-03-11 DIAGNOSIS — Z01411 Encounter for gynecological examination (general) (routine) with abnormal findings: Secondary | ICD-10-CM | POA: Diagnosis not present

## 2021-03-11 DIAGNOSIS — Z124 Encounter for screening for malignant neoplasm of cervix: Secondary | ICD-10-CM | POA: Diagnosis not present

## 2021-03-11 DIAGNOSIS — L918 Other hypertrophic disorders of the skin: Secondary | ICD-10-CM | POA: Diagnosis not present

## 2021-03-11 DIAGNOSIS — N760 Acute vaginitis: Secondary | ICD-10-CM | POA: Diagnosis not present

## 2021-03-11 DIAGNOSIS — Z1231 Encounter for screening mammogram for malignant neoplasm of breast: Secondary | ICD-10-CM | POA: Diagnosis not present

## 2021-03-19 ENCOUNTER — Ambulatory Visit
Admission: RE | Admit: 2021-03-19 | Discharge: 2021-03-19 | Disposition: A | Discharge status: Home / Self Care | Payer: BC Managed Care – PPO | Source: Ambulatory Visit | Attending: Internal Medicine | Admitting: Internal Medicine

## 2021-03-19 DIAGNOSIS — E7800 Pure hypercholesterolemia, unspecified: Secondary | ICD-10-CM | POA: Diagnosis not present

## 2021-03-19 DIAGNOSIS — E118 Type 2 diabetes mellitus with unspecified complications: Secondary | ICD-10-CM

## 2021-03-25 ENCOUNTER — Other Ambulatory Visit: Payer: Self-pay | Admitting: Obstetrics and Gynecology

## 2021-03-25 DIAGNOSIS — R928 Other abnormal and inconclusive findings on diagnostic imaging of breast: Secondary | ICD-10-CM

## 2021-03-26 DIAGNOSIS — R918 Other nonspecific abnormal finding of lung field: Secondary | ICD-10-CM | POA: Diagnosis not present

## 2021-03-26 DIAGNOSIS — I2510 Atherosclerotic heart disease of native coronary artery without angina pectoris: Secondary | ICD-10-CM | POA: Diagnosis not present

## 2021-03-26 DIAGNOSIS — I2584 Coronary atherosclerosis due to calcified coronary lesion: Secondary | ICD-10-CM | POA: Diagnosis not present

## 2021-04-13 ENCOUNTER — Other Ambulatory Visit: Payer: Self-pay

## 2021-04-13 ENCOUNTER — Ambulatory Visit
Admission: RE | Admit: 2021-04-13 | Discharge: 2021-04-13 | Disposition: A | Discharge status: Home / Self Care | Payer: BC Managed Care – PPO | Source: Ambulatory Visit | Attending: Obstetrics and Gynecology | Admitting: Obstetrics and Gynecology

## 2021-04-13 ENCOUNTER — Ambulatory Visit: Payer: BC Managed Care – PPO

## 2021-04-13 DIAGNOSIS — R928 Other abnormal and inconclusive findings on diagnostic imaging of breast: Secondary | ICD-10-CM

## 2021-04-13 DIAGNOSIS — R922 Inconclusive mammogram: Secondary | ICD-10-CM | POA: Diagnosis not present

## 2021-04-15 DIAGNOSIS — H52203 Unspecified astigmatism, bilateral: Secondary | ICD-10-CM | POA: Diagnosis not present

## 2021-04-15 DIAGNOSIS — E113393 Type 2 diabetes mellitus with moderate nonproliferative diabetic retinopathy without macular edema, bilateral: Secondary | ICD-10-CM | POA: Diagnosis not present

## 2021-04-15 DIAGNOSIS — H5213 Myopia, bilateral: Secondary | ICD-10-CM | POA: Diagnosis not present

## 2021-04-15 DIAGNOSIS — H524 Presbyopia: Secondary | ICD-10-CM | POA: Diagnosis not present

## 2021-04-16 DIAGNOSIS — E118 Type 2 diabetes mellitus with unspecified complications: Secondary | ICD-10-CM | POA: Diagnosis not present

## 2021-04-16 DIAGNOSIS — E7801 Familial hypercholesterolemia: Secondary | ICD-10-CM | POA: Diagnosis not present

## 2021-04-28 DIAGNOSIS — I10 Essential (primary) hypertension: Secondary | ICD-10-CM | POA: Diagnosis not present

## 2021-04-28 DIAGNOSIS — E7800 Pure hypercholesterolemia, unspecified: Secondary | ICD-10-CM | POA: Diagnosis not present

## 2021-04-28 DIAGNOSIS — E1169 Type 2 diabetes mellitus with other specified complication: Secondary | ICD-10-CM | POA: Diagnosis not present

## 2021-04-28 DIAGNOSIS — R809 Proteinuria, unspecified: Secondary | ICD-10-CM | POA: Diagnosis not present

## 2021-06-17 DIAGNOSIS — M064 Inflammatory polyarthropathy: Secondary | ICD-10-CM | POA: Diagnosis not present

## 2021-06-17 DIAGNOSIS — R768 Other specified abnormal immunological findings in serum: Secondary | ICD-10-CM | POA: Diagnosis not present

## 2021-06-17 DIAGNOSIS — D225 Melanocytic nevi of trunk: Secondary | ICD-10-CM | POA: Diagnosis not present

## 2021-06-17 DIAGNOSIS — D2272 Melanocytic nevi of left lower limb, including hip: Secondary | ICD-10-CM | POA: Diagnosis not present

## 2021-06-17 DIAGNOSIS — D2261 Melanocytic nevi of right upper limb, including shoulder: Secondary | ICD-10-CM | POA: Diagnosis not present

## 2021-06-17 DIAGNOSIS — L812 Freckles: Secondary | ICD-10-CM | POA: Diagnosis not present

## 2021-06-17 DIAGNOSIS — M79643 Pain in unspecified hand: Secondary | ICD-10-CM | POA: Diagnosis not present

## 2021-06-17 DIAGNOSIS — M1990 Unspecified osteoarthritis, unspecified site: Secondary | ICD-10-CM | POA: Diagnosis not present

## 2021-08-10 ENCOUNTER — Other Ambulatory Visit: Payer: Self-pay | Admitting: Internal Medicine

## 2021-08-10 DIAGNOSIS — R918 Other nonspecific abnormal finding of lung field: Secondary | ICD-10-CM

## 2021-09-08 DIAGNOSIS — E1165 Type 2 diabetes mellitus with hyperglycemia: Secondary | ICD-10-CM | POA: Diagnosis not present

## 2021-09-08 DIAGNOSIS — E118 Type 2 diabetes mellitus with unspecified complications: Secondary | ICD-10-CM | POA: Diagnosis not present

## 2021-09-15 DIAGNOSIS — E1169 Type 2 diabetes mellitus with other specified complication: Secondary | ICD-10-CM | POA: Diagnosis not present

## 2021-09-15 DIAGNOSIS — E7800 Pure hypercholesterolemia, unspecified: Secondary | ICD-10-CM | POA: Diagnosis not present

## 2021-09-15 DIAGNOSIS — I10 Essential (primary) hypertension: Secondary | ICD-10-CM | POA: Diagnosis not present

## 2021-09-15 DIAGNOSIS — R809 Proteinuria, unspecified: Secondary | ICD-10-CM | POA: Diagnosis not present

## 2021-10-08 ENCOUNTER — Ambulatory Visit
Admission: RE | Admit: 2021-10-08 | Discharge: 2021-10-08 | Disposition: A | Discharge status: Home / Self Care | Payer: BC Managed Care – PPO | Source: Ambulatory Visit | Attending: Internal Medicine | Admitting: Internal Medicine

## 2021-10-08 DIAGNOSIS — R918 Other nonspecific abnormal finding of lung field: Secondary | ICD-10-CM | POA: Diagnosis not present

## 2021-10-08 DIAGNOSIS — R911 Solitary pulmonary nodule: Secondary | ICD-10-CM | POA: Diagnosis not present

## 2021-10-22 DIAGNOSIS — E119 Type 2 diabetes mellitus without complications: Secondary | ICD-10-CM | POA: Diagnosis not present

## 2021-10-22 DIAGNOSIS — E7801 Familial hypercholesterolemia: Secondary | ICD-10-CM | POA: Diagnosis not present

## 2021-10-22 DIAGNOSIS — I10 Essential (primary) hypertension: Secondary | ICD-10-CM | POA: Diagnosis not present

## 2021-10-27 DIAGNOSIS — F5104 Psychophysiologic insomnia: Secondary | ICD-10-CM | POA: Diagnosis not present

## 2021-10-27 DIAGNOSIS — I10 Essential (primary) hypertension: Secondary | ICD-10-CM | POA: Diagnosis not present

## 2021-10-27 DIAGNOSIS — R413 Other amnesia: Secondary | ICD-10-CM | POA: Diagnosis not present

## 2021-10-27 DIAGNOSIS — M064 Inflammatory polyarthropathy: Secondary | ICD-10-CM | POA: Diagnosis not present

## 2021-10-27 DIAGNOSIS — Z0000 Encounter for general adult medical examination without abnormal findings: Secondary | ICD-10-CM | POA: Diagnosis not present

## 2021-10-27 DIAGNOSIS — F411 Generalized anxiety disorder: Secondary | ICD-10-CM | POA: Diagnosis not present

## 2021-11-20 DIAGNOSIS — M79662 Pain in left lower leg: Secondary | ICD-10-CM | POA: Diagnosis not present

## 2022-02-09 ENCOUNTER — Ambulatory Visit: Payer: BC Managed Care – PPO | Admitting: Emergency Medicine

## 2022-02-09 ENCOUNTER — Encounter: Payer: Self-pay | Admitting: Emergency Medicine

## 2022-02-09 DIAGNOSIS — R9389 Abnormal findings on diagnostic imaging of other specified body structures: Secondary | ICD-10-CM | POA: Diagnosis not present

## 2022-02-09 NOTE — Assessment & Plan Note (Signed)
Pleural-based small 6-8 mm posterior right lower lobe pulmonary nodule, appears benign and a low risk patient.  Will need follow-up.  Associated with some evolving medial right lower lobe segmental groundglass infiltrate, progressed compared with April.  Cause unclear.  She denies any infectious symptoms although she is on hydroxychloroquine and could be at risk for opportunistic infection.  More likely this is due to her underlying inflammatory process.  She has posterior pharyngeal inflammation, joint inflammation that is been ascribed to an inflammatory arthritis.  We will plan to repeat her CT in October.  If progressing then I would favor navigational bronchoscopy for biopsies and culture data.  We will plan to repeat your CT scan of the chest without contrast in October 2023. Follow with rheumatology as planned.  We will obtain copies of your office notes. Follow Dr. Lamonte Sakai in October after your CT so we can review the results together

## 2022-02-09 NOTE — Progress Notes (Signed)
Subjective:    Patient ID: [name], female    DOB: 1965-07-05, 56 y.o.   MRN: [id number]   HPI 56 year old never smoker with history of obesity, hypertension and diabetes, hyperlipidemia, depression and anxiety.  She has been seen by Rheumatology for an inflammatory arthritis. She is on hydroxychloriquine.  She is here today to evaluate abnormal CT scan of the chest.  Minimal resp sx. She does have some nasal congestion and UA mucous. No real cough. No SOB, is a bit deconditioned but can exert. No pain. She had COVID over a year ago.   Cardiac calcium CT chest 03/19/2021 reviewed by me showed calcium score 40, an 8 mm posterior right lower lobe superior segmental nodule, 3 mm left lower lobe nodule, some patchy groundglass attenuation in the medial aspect of the right lower lobe.  This prompted a repeat CT  CT scan of the chest without contrast 10/08/2021 reviewed by me showed stable to smaller right lower lobe pulmonary nodule, mean diameter 5 mm with benign characteristics.  The 3 mm left lower lobe pulmonary nodule is likely a confluence of vascular structures.  There is some right lower lobe and patchy lingular groundglass infiltrate   Review of Systems As per HPI  Past Medical History:  Diagnosis Date   Abnormal pap    Anxiety    Depression    Deviated septum 1984   Fibroids    h/o   Groin pain    h/o   Hypertension    Type II diabetes mellitus ([place])      Family History  Problem Relation Age of Onset   Scoliosis Brother    Anxiety disorder Brother    Depression Brother    Angina Maternal Grandmother    Hypertension Maternal Grandmother    Anxiety disorder Maternal Grandmother    Depression Maternal Grandmother    Atrial fibrillation Father    Hypertension Father    Stroke Father    Bipolar disorder Father    Colon cancer Maternal Grandfather    Anxiety disorder Maternal Grandfather    Depression Maternal Grandfather    Depression Mother    Anxiety disorder  Mother    Suicidality Paternal Grandfather      Social History   Socioeconomic History   Marital status: Married    Spouse name: Not on file   Number of children: Not on file   Years of education: Not on file   Highest education level: Not on file  Occupational History   Not on file  Tobacco Use   Smoking status: Never   Smokeless tobacco: Never  Substance and Sexual Activity   Alcohol use: Yes    Alcohol/week: 10.0 - 12.0 standard drinks of alcohol    Types: 10 - 12 Glasses of wine per week    Comment: 10-12 glasses of wine per week   Drug use: No   Sexual activity: Not Currently    Birth control/protection: None  Other Topics Concern   Not on file  Social History Narrative   Not on file   Social Determinants of Health   Financial Resource Strain: Not on file  Food Insecurity: Not on file  Transportation Needs: Not on file  Physical Activity: Not on file  Stress: Not on file  Social Connections: Not on file  Intimate Partner Violence: Not on file     No Known Allergies   Outpatient Medications Prior to Visit  Medication Sig Dispense Refill   ALPRAZolam (XANAX) 0.5 MG tablet  Take 0.5 mg by mouth at bedtime as needed.     Amlodipine-Olmesartan (AZOR PO) Take by mouth.     ATORVASTATIN CALCIUM PO Take by mouth.     bisoprolol (ZEBETA) 10 MG tablet TAKE ONE tablet Orally ONCE a DAY     empagliflozin (JARDIANCE) 25 MG TABS tablet Take 1 tablet by mouth daily.     glucose blood test strip      hydroxychloroquine (PLAQUENIL) 200 MG tablet TAKE TWO TABLETS BY MOUTH DAILY AS DIRECTED     ibuprofen (ADVIL,MOTRIN) 800 MG tablet Take 1 tablet (800 mg total) by mouth 3 (three) times daily. 21 tablet 0   losartan (COZAAR) 50 MG tablet Take 50 mg by mouth daily.     metFORMIN (GLUMETZA) 500 MG (MOD) 24 hr tablet Take 500 mg by mouth 2 (two) times daily at 10 am and 4 pm.      metoprolol succinate (TOPROL-XL) 25 MG 24 hr tablet Take 25 mg by mouth daily.      promethazine-dextromethorphan (PROMETHAZINE-DM) 6.25-15 MG/5ML syrup Take by mouth.     Dulaglutide (TRULICITY [place]) Inject into the skin. (Patient not taking: Reported on 02/09/2022)     No facility-administered medications prior to visit.        Objective:   Physical Exam Vitals:   02/09/22 1046  BP: (!) 152/78  Pulse: 87  Temp: 99.4 F (37.4 C)  TempSrc: Oral  SpO2: 97%  Weight: 185 lb 14.2 oz (84.3 kg)  Height: '4\' 11"'$  (1.499 m)    Gen: Pleasant, well-nourished, in no distress,  normal affect  ENT: No lesions,  mouth clear,  oropharynx clear, no postnasal drip  Neck: No JVD, no stridor  Lungs: No use of accessory muscles, no crackles or wheezing on normal respiration, no wheeze on forced expiration  Cardiovascular: RRR, heart sounds normal, no murmur or gallops, no peripheral edema  Musculoskeletal: No deformities, no cyanosis or clubbing  Neuro: alert, awake, non focal  Skin: Warm, no lesions or rash       Assessment & Plan:  Abnormal CT of the chest Pleural-based small 6-8 mm posterior right lower lobe pulmonary nodule, appears benign and a low risk patient.  Will need follow-up.  Associated with some evolving medial right lower lobe segmental groundglass infiltrate, progressed compared with April.  Cause unclear.  She denies any infectious symptoms although she is on hydroxychloroquine and could be at risk for opportunistic infection.  More likely this is due to her underlying inflammatory process.  She has posterior pharyngeal inflammation, joint inflammation that is been ascribed to an inflammatory arthritis.  We will plan to repeat her CT in October.  If progressing then I would favor navigational bronchoscopy for biopsies and culture data.  We will plan to repeat your CT scan of the chest without contrast in October 2023. Follow with rheumatology as planned.  We will obtain copies of your office notes. Follow Dr. Lamonte Sakai in October after your CT so we can review  the results together   Baltazar Apo, MD, PhD 02/09/2022, 11:13 AM [facility] [phone] or if no answer before 7:00PM call [phone] For any issues after 7:00PM please call eLink [phone]

## 2022-02-09 NOTE — Patient Instructions (Signed)
We will plan to repeat your CT scan of the chest without contrast in October 2023. Follow with rheumatology as planned.  We will obtain copies of your office notes. Follow Dr. Lamonte Sakai in October after your CT so we can review the results together

## 2022-02-09 NOTE — Addendum Note (Signed)
Addended by: Gavin Potters R on: 02/09/2022 11:30 AM   Modules accepted: Orders

## 2022-02-11 DIAGNOSIS — M064 Inflammatory polyarthropathy: Secondary | ICD-10-CM | POA: Diagnosis not present

## 2022-02-11 DIAGNOSIS — M1990 Unspecified osteoarthritis, unspecified site: Secondary | ICD-10-CM | POA: Diagnosis not present

## 2022-02-11 DIAGNOSIS — E119 Type 2 diabetes mellitus without complications: Secondary | ICD-10-CM | POA: Diagnosis not present

## 2022-02-11 DIAGNOSIS — M79643 Pain in unspecified hand: Secondary | ICD-10-CM | POA: Diagnosis not present

## 2022-02-12 ENCOUNTER — Telehealth: Payer: Self-pay | Admitting: Emergency Medicine

## 2022-02-12 NOTE — Telephone Encounter (Signed)
Attempted to call pt but unable to reach. Left a detailed message for pt stating to her to call medical records and did provide her with that phone number. Nothing further needed.

## 2022-02-18 DIAGNOSIS — J029 Acute pharyngitis, unspecified: Secondary | ICD-10-CM | POA: Diagnosis not present

## 2022-02-24 ENCOUNTER — Ambulatory Visit: Payer: BC Managed Care – PPO | Admitting: Diagnostic Neuroimaging

## 2022-03-01 DIAGNOSIS — H43813 Vitreous degeneration, bilateral: Secondary | ICD-10-CM | POA: Diagnosis not present

## 2022-03-01 DIAGNOSIS — H2513 Age-related nuclear cataract, bilateral: Secondary | ICD-10-CM | POA: Diagnosis not present

## 2022-03-01 DIAGNOSIS — Z79899 Other long term (current) drug therapy: Secondary | ICD-10-CM | POA: Diagnosis not present

## 2022-03-01 DIAGNOSIS — E113392 Type 2 diabetes mellitus with moderate nonproliferative diabetic retinopathy without macular edema, left eye: Secondary | ICD-10-CM | POA: Diagnosis not present

## 2022-03-09 DIAGNOSIS — B354 Tinea corporis: Secondary | ICD-10-CM | POA: Diagnosis not present

## 2022-03-16 DIAGNOSIS — Z79899 Other long term (current) drug therapy: Secondary | ICD-10-CM | POA: Diagnosis not present

## 2022-03-23 DIAGNOSIS — L308 Other specified dermatitis: Secondary | ICD-10-CM | POA: Diagnosis not present

## 2022-03-23 DIAGNOSIS — B354 Tinea corporis: Secondary | ICD-10-CM | POA: Diagnosis not present

## 2022-03-24 DIAGNOSIS — I10 Essential (primary) hypertension: Secondary | ICD-10-CM | POA: Diagnosis not present

## 2022-03-31 ENCOUNTER — Ambulatory Visit
Admission: RE | Admit: 2022-03-31 | Discharge: 2022-03-31 | Disposition: A | Discharge status: Home / Self Care | Payer: BC Managed Care – PPO | Source: Ambulatory Visit | Attending: Emergency Medicine | Admitting: Emergency Medicine

## 2022-03-31 DIAGNOSIS — R9389 Abnormal findings on diagnostic imaging of other specified body structures: Secondary | ICD-10-CM

## 2022-04-07 ENCOUNTER — Ambulatory Visit: Payer: BC Managed Care – PPO | Admitting: Emergency Medicine

## 2022-04-16 ENCOUNTER — Ambulatory Visit
Admission: RE | Admit: 2022-04-16 | Discharge: 2022-04-16 | Disposition: A | Discharge status: Home / Self Care | Payer: BC Managed Care – PPO | Source: Ambulatory Visit | Attending: Emergency Medicine | Admitting: Emergency Medicine

## 2022-04-16 DIAGNOSIS — I700 Atherosclerosis of aorta: Secondary | ICD-10-CM | POA: Diagnosis not present

## 2022-04-16 DIAGNOSIS — R918 Other nonspecific abnormal finding of lung field: Secondary | ICD-10-CM | POA: Diagnosis not present

## 2022-04-16 DIAGNOSIS — R911 Solitary pulmonary nodule: Secondary | ICD-10-CM | POA: Diagnosis not present

## 2022-04-20 ENCOUNTER — Encounter: Payer: Self-pay | Admitting: Emergency Medicine

## 2022-04-20 ENCOUNTER — Ambulatory Visit: Payer: BC Managed Care – PPO | Admitting: Emergency Medicine

## 2022-04-20 VITALS — BP 126/76 | HR 78 | Temp 98.2°F | Ht 59.0 in | Wt 184.4 lb

## 2022-04-20 DIAGNOSIS — R9389 Abnormal findings on diagnostic imaging of other specified body structures: Secondary | ICD-10-CM

## 2022-04-20 NOTE — Addendum Note (Signed)
Addended by: Chanetta Marshall on: 04/20/2022 11:54 AM   Modules accepted: Orders

## 2022-04-20 NOTE — Assessment & Plan Note (Signed)
8 mm pulmonary nodule is stable in size, likely benign.  We now have 1 year of stability.  I recommended that we follow for 2 years total.  There was some medial basilar groundglass changes that now is forming scar.  This is adjacent to some osteophytic inflammation in the spine.  No symptoms or any other clear evidence for autoimmune disease or opportunistic infection.  I do not think we need to consider bronchoscopy at this time.  We will follow the serial CT in 1 year.

## 2022-04-20 NOTE — Patient Instructions (Addendum)
We reviewed CT scan of the chest today.  This is stable to improved.  Good news. We will plan to repeat your CT scan of the chest in November 2024.   Follow with Dr. Lamonte Sakai in November to review that scan.  Call sooner if you have any problems.

## 2022-04-20 NOTE — Progress Notes (Signed)
Subjective:    Patient ID: [name], female    DOB: May 26, 1966, 56 y.o.   MRN: [id number]   HPI  ROV 04/20/22 --56 year old woman with a history of inflammatory arthritis on hydroxychloroquine, hypertension, diabetes, hyperlipidemia, obesity.  She is a never smoker.  I have been following her for an abnormal CT scan of the chest characterized by peripheral pulmonary nodular disease and some medial basilar right lower lobe inflammatory change near some osteophytic prominence.  We decided to repeat her CT chest for interval stability.  She reports  CT scan of the chest done 04/19/2022 reviewed by me shows stable 8 mm posterior right lower lobe superior segmental nodule, no other notable nodules.  The right lower lobe medial basilar inflammatory change has become less prominent and appears to be scar.   Review of Systems As per HPI  Past Medical History:  Diagnosis Date   Abnormal pap    Anxiety    Depression    Deviated septum 1984   Fibroids    h/o   Groin pain    h/o   Hypertension    Type II diabetes mellitus ([place])      Family History  Problem Relation Age of Onset   Scoliosis Brother    Anxiety disorder Brother    Depression Brother    Angina Maternal Grandmother    Hypertension Maternal Grandmother    Anxiety disorder Maternal Grandmother    Depression Maternal Grandmother    Atrial fibrillation Father    Hypertension Father    Stroke Father    Bipolar disorder Father    Colon cancer Maternal Grandfather    Anxiety disorder Maternal Grandfather    Depression Maternal Grandfather    Depression Mother    Anxiety disorder Mother    Suicidality Paternal Grandfather      Social History   Socioeconomic History   Marital status: Married    Spouse name: Not on file   Number of children: Not on file   Years of education: Not on file   Highest education level: Not on file  Occupational History   Not on file  Tobacco Use   Smoking status: Never   Smokeless  tobacco: Never  Substance and Sexual Activity   Alcohol use: Yes    Alcohol/week: 10.0 - 12.0 standard drinks of alcohol    Types: 10 - 12 Glasses of wine per week    Comment: 10-12 glasses of wine per week   Drug use: No   Sexual activity: Not Currently    Birth control/protection: None  Other Topics Concern   Not on file  Social History Narrative   Not on file   Social Determinants of Health   Financial Resource Strain: Not on file  Food Insecurity: Not on file  Transportation Needs: Not on file  Physical Activity: Not on file  Stress: Not on file  Social Connections: Not on file  Intimate Partner Violence: Not on file     No Known Allergies   Outpatient Medications Prior to Visit  Medication Sig Dispense Refill   ALPRAZolam (XANAX) 0.5 MG tablet Take 0.5 mg by mouth at bedtime as needed.     Amlodipine-Olmesartan (AZOR PO) Take by mouth.     aspirin EC 81 MG tablet Take 81 mg by mouth daily. Swallow whole.     ATORVASTATIN CALCIUM PO Take by mouth.     bisoprolol (ZEBETA) 10 MG tablet TAKE ONE tablet Orally ONCE a DAY     Dulaglutide (  TRULICITY [place]) Inject into the skin.     empagliflozin (JARDIANCE) 25 MG TABS tablet Take 1 tablet by mouth daily.     glucose blood test strip      hydroxychloroquine (PLAQUENIL) 200 MG tablet TAKE TWO TABLETS BY MOUTH DAILY AS DIRECTED     losartan (COZAAR) 50 MG tablet Take 50 mg by mouth daily.     losartan-hydrochlorothiazide (HYZAAR) 100-25 MG tablet TAKE 1 tablet Orally Once a day.     metFORMIN (GLUMETZA) 500 MG (MOD) 24 hr tablet Take 500 mg by mouth daily.     naproxen (NAPROSYN) 500 MG tablet SMARTSIG:1 Tablet(s) By Mouth Every 12 Hours PRN     OZEMPIC, 1 MG/DOSE, 4 MG/3ML SOPN Inject 1 mg into the skin once a week.     metoprolol succinate (TOPROL-XL) 25 MG 24 hr tablet Take 25 mg by mouth daily.     promethazine-dextromethorphan (PROMETHAZINE-DM) 6.25-15 MG/5ML syrup Take by mouth.     ibuprofen (ADVIL,MOTRIN) 800 MG tablet  Take 1 tablet (800 mg total) by mouth 3 (three) times daily. 21 tablet 0   No facility-administered medications prior to visit.        Objective:   Physical Exam Vitals:   04/20/22 1007  BP: 126/76  Pulse: 78  Temp: 98.2 F (36.8 C)  TempSrc: Oral  SpO2: 98%  Weight: 184 lb 6.4 oz (83.6 kg)  Height: '4\' 11"'$  (1.499 m)    Gen: Pleasant, well-nourished, in no distress,  normal affect  ENT: No lesions,  mouth clear,  oropharynx clear, no postnasal drip  Neck: No JVD, no stridor  Lungs: No use of accessory muscles, no crackles or wheezing on normal respiration, no wheeze on forced expiration  Cardiovascular: RRR, heart sounds normal, no murmur or gallops, no peripheral edema  Musculoskeletal: No deformities, no cyanosis or clubbing  Neuro: alert, awake, non focal  Skin: Warm, no lesions or rash       Assessment & Plan:  Abnormal CT of the chest 8 mm pulmonary nodule is stable in size, likely benign.  We now have 1 year of stability.  I recommended that we follow for 2 years total.  There was some medial basilar groundglass changes that now is forming scar.  This is adjacent to some osteophytic inflammation in the spine.  No symptoms or any other clear evidence for autoimmune disease or opportunistic infection.  I do not think we need to consider bronchoscopy at this time.  We will follow the serial CT in 1 year.   Baltazar Apo, MD, PhD 04/20/2022, 10:23 AM [facility] [phone] or if no answer before 7:00PM call [phone] For any issues after 7:00PM please call eLink [phone]

## 2022-04-29 DIAGNOSIS — I10 Essential (primary) hypertension: Secondary | ICD-10-CM | POA: Diagnosis not present

## 2022-05-13 DIAGNOSIS — I10 Essential (primary) hypertension: Secondary | ICD-10-CM | POA: Diagnosis not present

## 2022-05-13 DIAGNOSIS — R809 Proteinuria, unspecified: Secondary | ICD-10-CM | POA: Diagnosis not present

## 2022-05-13 DIAGNOSIS — E1165 Type 2 diabetes mellitus with hyperglycemia: Secondary | ICD-10-CM | POA: Diagnosis not present

## 2022-05-13 DIAGNOSIS — E7800 Pure hypercholesterolemia, unspecified: Secondary | ICD-10-CM | POA: Diagnosis not present

## 2022-05-17 DIAGNOSIS — I10 Essential (primary) hypertension: Secondary | ICD-10-CM | POA: Diagnosis not present

## 2022-05-17 DIAGNOSIS — E7800 Pure hypercholesterolemia, unspecified: Secondary | ICD-10-CM | POA: Diagnosis not present

## 2022-05-17 DIAGNOSIS — E669 Obesity, unspecified: Secondary | ICD-10-CM | POA: Diagnosis not present

## 2022-05-17 DIAGNOSIS — E118 Type 2 diabetes mellitus with unspecified complications: Secondary | ICD-10-CM | POA: Diagnosis not present

## 2022-06-23 ENCOUNTER — Ambulatory Visit: Payer: BC Managed Care – PPO | Admitting: Diagnostic Neuroimaging

## 2022-06-29 DIAGNOSIS — D2262 Melanocytic nevi of left upper limb, including shoulder: Secondary | ICD-10-CM | POA: Diagnosis not present

## 2022-06-29 DIAGNOSIS — L853 Xerosis cutis: Secondary | ICD-10-CM | POA: Diagnosis not present

## 2022-06-29 DIAGNOSIS — D225 Melanocytic nevi of trunk: Secondary | ICD-10-CM | POA: Diagnosis not present

## 2022-06-29 DIAGNOSIS — D2261 Melanocytic nevi of right upper limb, including shoulder: Secondary | ICD-10-CM | POA: Diagnosis not present

## 2022-06-30 DIAGNOSIS — Z6837 Body mass index (BMI) 37.0-37.9, adult: Secondary | ICD-10-CM | POA: Diagnosis not present

## 2022-06-30 DIAGNOSIS — Z01411 Encounter for gynecological examination (general) (routine) with abnormal findings: Secondary | ICD-10-CM | POA: Diagnosis not present

## 2022-07-07 DIAGNOSIS — I10 Essential (primary) hypertension: Secondary | ICD-10-CM | POA: Diagnosis not present

## 2022-07-07 DIAGNOSIS — I2584 Coronary atherosclerosis due to calcified coronary lesion: Secondary | ICD-10-CM | POA: Diagnosis not present

## 2022-07-08 DIAGNOSIS — E669 Obesity, unspecified: Secondary | ICD-10-CM | POA: Diagnosis not present

## 2022-07-08 DIAGNOSIS — E118 Type 2 diabetes mellitus with unspecified complications: Secondary | ICD-10-CM | POA: Diagnosis not present

## 2022-07-08 DIAGNOSIS — I2510 Atherosclerotic heart disease of native coronary artery without angina pectoris: Secondary | ICD-10-CM | POA: Diagnosis not present

## 2022-07-08 DIAGNOSIS — I10 Essential (primary) hypertension: Secondary | ICD-10-CM | POA: Diagnosis not present

## 2022-07-19 DIAGNOSIS — M1990 Unspecified osteoarthritis, unspecified site: Secondary | ICD-10-CM | POA: Diagnosis not present

## 2022-07-19 DIAGNOSIS — R768 Other specified abnormal immunological findings in serum: Secondary | ICD-10-CM | POA: Diagnosis not present

## 2022-07-19 DIAGNOSIS — M79643 Pain in unspecified hand: Secondary | ICD-10-CM | POA: Diagnosis not present

## 2022-07-19 DIAGNOSIS — Z6838 Body mass index (BMI) 38.0-38.9, adult: Secondary | ICD-10-CM | POA: Diagnosis not present

## 2022-08-03 ENCOUNTER — Ambulatory Visit: Payer: BC Managed Care – PPO | Admitting: Diagnostic Neuroimaging

## 2022-09-19 DIAGNOSIS — M1712 Unilateral primary osteoarthritis, left knee: Secondary | ICD-10-CM | POA: Diagnosis not present

## 2022-09-28 DIAGNOSIS — M6281 Muscle weakness (generalized): Secondary | ICD-10-CM | POA: Diagnosis not present

## 2022-09-28 DIAGNOSIS — R269 Unspecified abnormalities of gait and mobility: Secondary | ICD-10-CM | POA: Diagnosis not present

## 2022-09-28 DIAGNOSIS — M25661 Stiffness of right knee, not elsewhere classified: Secondary | ICD-10-CM | POA: Diagnosis not present

## 2022-09-28 DIAGNOSIS — M25662 Stiffness of left knee, not elsewhere classified: Secondary | ICD-10-CM | POA: Diagnosis not present

## 2022-10-27 DIAGNOSIS — R0982 Postnasal drip: Secondary | ICD-10-CM | POA: Diagnosis not present

## 2022-10-27 DIAGNOSIS — R0981 Nasal congestion: Secondary | ICD-10-CM | POA: Diagnosis not present

## 2022-10-27 DIAGNOSIS — J029 Acute pharyngitis, unspecified: Secondary | ICD-10-CM | POA: Diagnosis not present

## 2022-10-27 DIAGNOSIS — B9689 Other specified bacterial agents as the cause of diseases classified elsewhere: Secondary | ICD-10-CM | POA: Diagnosis not present

## 2022-11-04 DIAGNOSIS — R21 Rash and other nonspecific skin eruption: Secondary | ICD-10-CM | POA: Diagnosis not present

## 2022-11-10 DIAGNOSIS — E118 Type 2 diabetes mellitus with unspecified complications: Secondary | ICD-10-CM | POA: Diagnosis not present

## 2022-11-10 DIAGNOSIS — E7800 Pure hypercholesterolemia, unspecified: Secondary | ICD-10-CM | POA: Diagnosis not present

## 2022-11-10 DIAGNOSIS — E669 Obesity, unspecified: Secondary | ICD-10-CM | POA: Diagnosis not present

## 2022-11-15 DIAGNOSIS — Z1231 Encounter for screening mammogram for malignant neoplasm of breast: Secondary | ICD-10-CM | POA: Diagnosis not present

## 2022-11-19 DIAGNOSIS — E118 Type 2 diabetes mellitus with unspecified complications: Secondary | ICD-10-CM | POA: Diagnosis not present

## 2022-11-19 DIAGNOSIS — I2510 Atherosclerotic heart disease of native coronary artery without angina pectoris: Secondary | ICD-10-CM | POA: Diagnosis not present

## 2022-11-19 DIAGNOSIS — E7800 Pure hypercholesterolemia, unspecified: Secondary | ICD-10-CM | POA: Diagnosis not present

## 2022-11-19 DIAGNOSIS — I10 Essential (primary) hypertension: Secondary | ICD-10-CM | POA: Diagnosis not present

## 2022-11-22 DIAGNOSIS — L308 Other specified dermatitis: Secondary | ICD-10-CM | POA: Diagnosis not present

## 2022-11-23 DIAGNOSIS — H2513 Age-related nuclear cataract, bilateral: Secondary | ICD-10-CM | POA: Diagnosis not present

## 2022-11-23 DIAGNOSIS — H52203 Unspecified astigmatism, bilateral: Secondary | ICD-10-CM | POA: Diagnosis not present

## 2022-11-23 DIAGNOSIS — Z79899 Other long term (current) drug therapy: Secondary | ICD-10-CM | POA: Diagnosis not present

## 2022-11-23 DIAGNOSIS — H524 Presbyopia: Secondary | ICD-10-CM | POA: Diagnosis not present

## 2022-11-23 DIAGNOSIS — E113393 Type 2 diabetes mellitus with moderate nonproliferative diabetic retinopathy without macular edema, bilateral: Secondary | ICD-10-CM | POA: Diagnosis not present

## 2022-11-23 DIAGNOSIS — H5213 Myopia, bilateral: Secondary | ICD-10-CM | POA: Diagnosis not present

## 2022-11-23 DIAGNOSIS — H43813 Vitreous degeneration, bilateral: Secondary | ICD-10-CM | POA: Diagnosis not present

## 2022-12-06 DIAGNOSIS — M0609 Rheumatoid arthritis without rheumatoid factor, multiple sites: Secondary | ICD-10-CM | POA: Diagnosis not present

## 2022-12-06 DIAGNOSIS — Z79899 Other long term (current) drug therapy: Secondary | ICD-10-CM | POA: Diagnosis not present

## 2022-12-06 DIAGNOSIS — R768 Other specified abnormal immunological findings in serum: Secondary | ICD-10-CM | POA: Diagnosis not present

## 2022-12-06 DIAGNOSIS — M1990 Unspecified osteoarthritis, unspecified site: Secondary | ICD-10-CM | POA: Diagnosis not present

## 2023-01-06 DIAGNOSIS — M1990 Unspecified osteoarthritis, unspecified site: Secondary | ICD-10-CM | POA: Diagnosis not present

## 2023-01-06 DIAGNOSIS — Z79899 Other long term (current) drug therapy: Secondary | ICD-10-CM | POA: Diagnosis not present

## 2023-01-06 DIAGNOSIS — M0609 Rheumatoid arthritis without rheumatoid factor, multiple sites: Secondary | ICD-10-CM | POA: Diagnosis not present

## 2023-01-06 DIAGNOSIS — R768 Other specified abnormal immunological findings in serum: Secondary | ICD-10-CM | POA: Diagnosis not present

## 2023-02-07 DIAGNOSIS — L089 Local infection of the skin and subcutaneous tissue, unspecified: Secondary | ICD-10-CM | POA: Diagnosis not present

## 2023-02-07 DIAGNOSIS — B9689 Other specified bacterial agents as the cause of diseases classified elsewhere: Secondary | ICD-10-CM | POA: Diagnosis not present

## 2023-02-16 DIAGNOSIS — L089 Local infection of the skin and subcutaneous tissue, unspecified: Secondary | ICD-10-CM | POA: Diagnosis not present

## 2023-02-16 DIAGNOSIS — B9689 Other specified bacterial agents as the cause of diseases classified elsewhere: Secondary | ICD-10-CM | POA: Diagnosis not present

## 2023-02-24 DIAGNOSIS — L821 Other seborrheic keratosis: Secondary | ICD-10-CM | POA: Diagnosis not present

## 2023-02-24 DIAGNOSIS — S80862A Insect bite (nonvenomous), left lower leg, initial encounter: Secondary | ICD-10-CM | POA: Diagnosis not present

## 2023-03-22 DIAGNOSIS — L309 Dermatitis, unspecified: Secondary | ICD-10-CM | POA: Diagnosis not present

## 2023-03-25 ENCOUNTER — Encounter: Payer: Self-pay | Admitting: Emergency Medicine

## 2023-04-01 IMAGING — CT CT CARDIAC CORONARY ARTERY CALCIUM SCORE
3 series · 14 of 20 positions shown, 16 images · non-contrast
Comparison: No priors.

CLINICAL DATA: 55-year-old Caucasian female with history of high
cholesterol. Family history of coronary artery disease.

EXAM:
CT CARDIAC CORONARY ARTERY CALCIUM SCORE
TECHNIQUE: Non-contrast imaging through the heart was performed using
prospective ECG gating. Image post processing was performed on an
independent workstation, allowing for quantitative analysis of the
heart and coronary arteries. Note that this exam targets the heart
and the chest was not imaged in its entirety.

[Series 2: calcium scoring 2.00 qr36 bestdiast 70% hrt calciu · axial · 0.39mm/px · z∈[+1547,+1631]mm · 4 of 70 slices shown]
[im 14/70  vessel]
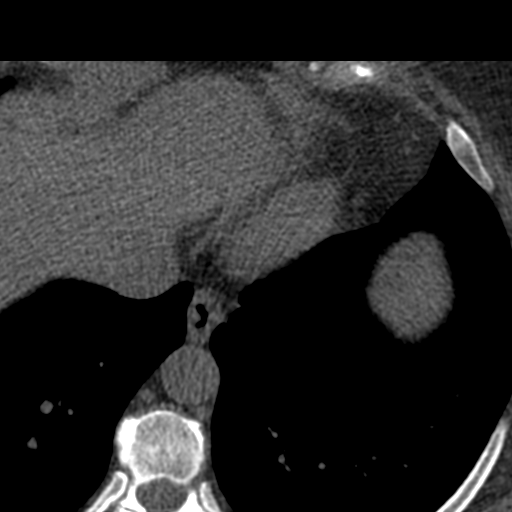
[im 28/70  vessel]
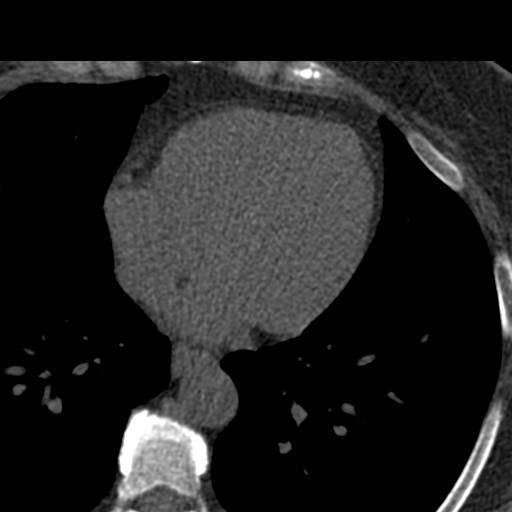
[im 42/70  vessel]
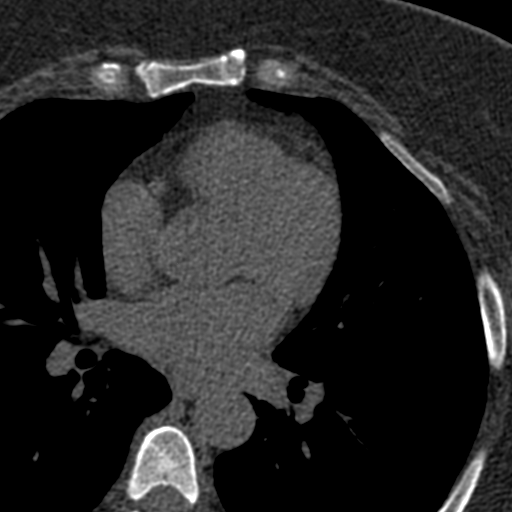
[im 56/70  vessel]
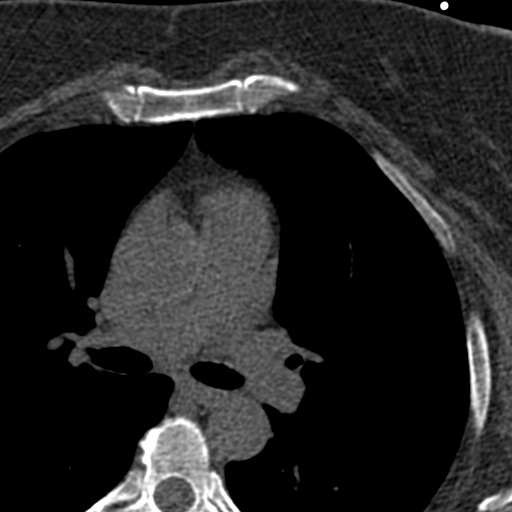

[Series 3: calcium scoring 2.00 br40 bestdiast 70% axial · axial · 0.64mm/px · z∈[+1543,+1635]mm · 5 of 70 slices shown, 7 images]
[im 12/70  vessel]
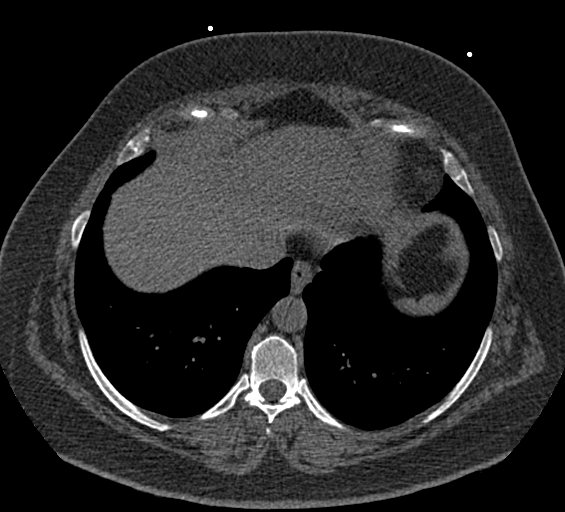
[im 12/70  lung]
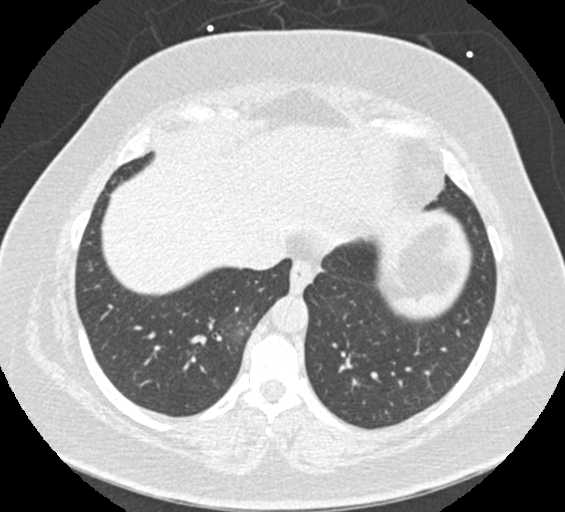
[im 24/70  vessel]
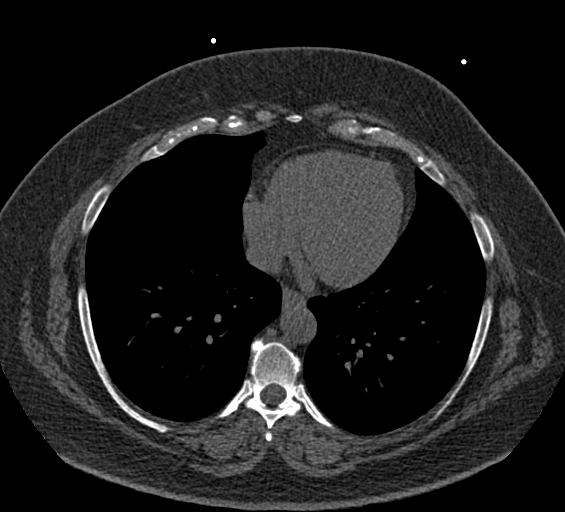
[im 35/70  vessel]
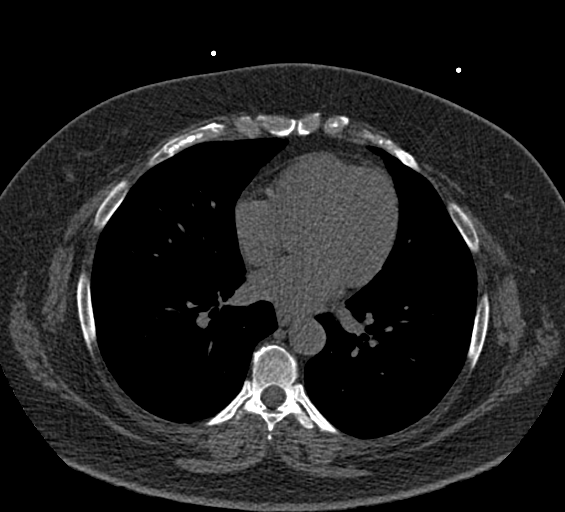
[im 47/70  vessel]
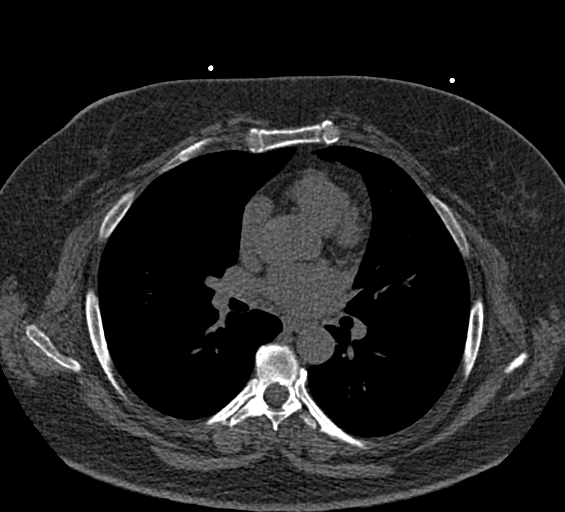
[im 58/70  vessel]
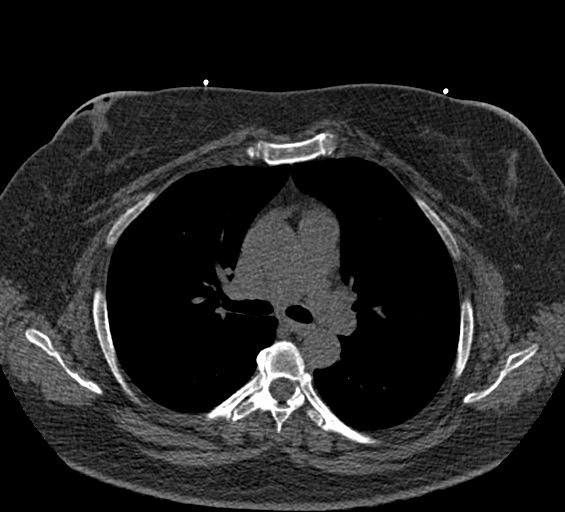
[im 58/70  lung]
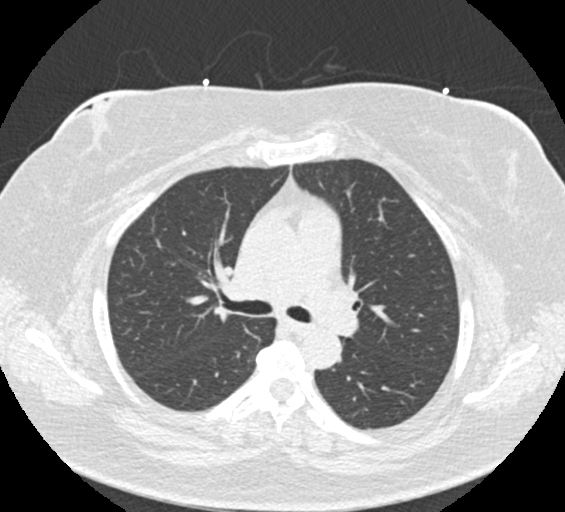

[Series 9: calcium scoring 2.00 br60 bestdiast 70% lungs · axial · 0.64mm/px · z∈[+1543,+1635]mm · 5 of 70 slices shown]
[im 12/70  vessel]
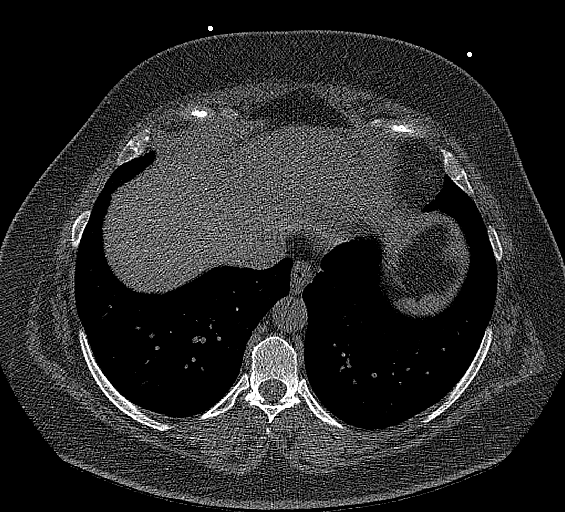
[im 24/70  vessel]
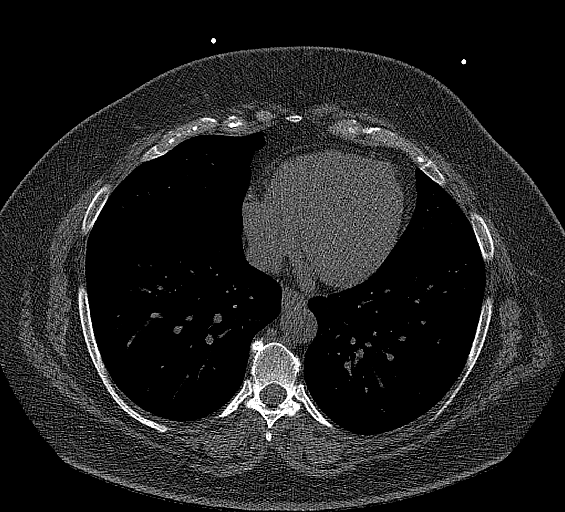
[im 35/70  vessel]
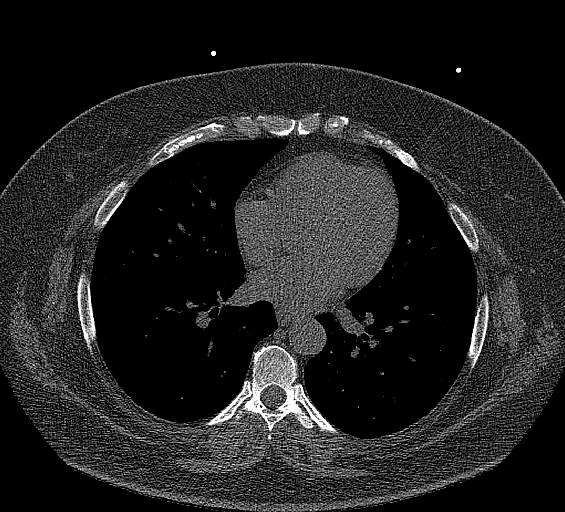
[im 47/70  vessel]
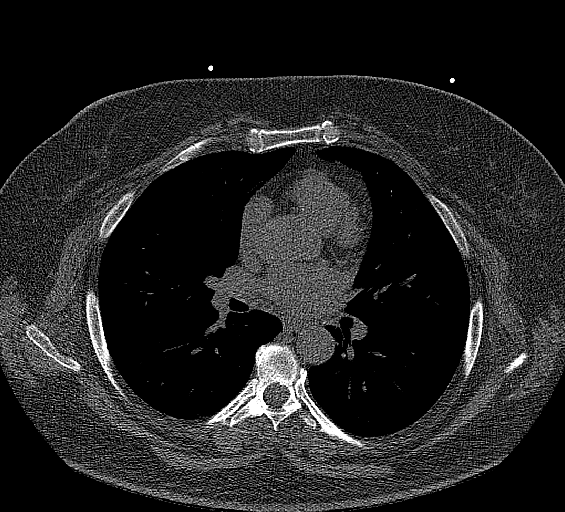
[im 58/70  vessel]
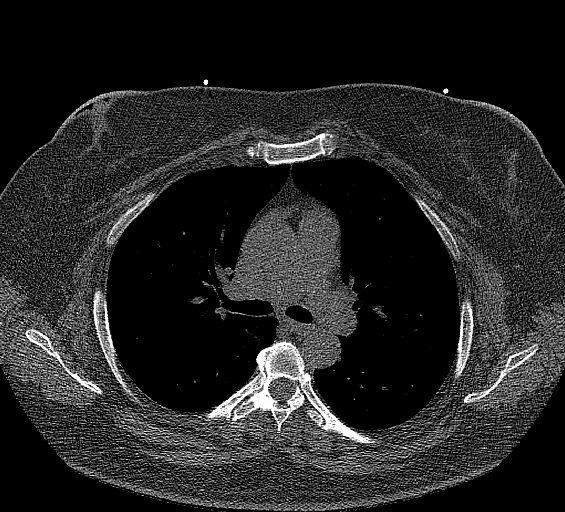

[14 of 20 positions shown; findings below may reference images not displayed]

FINDINGS: CORONARY CALCIUM SCORES:

Left Main: 0

LAD: 1

LCx: 0

RCA: 39

Total Agatston Score: 40

[HOSPITAL] percentile: 89th

AORTA MEASUREMENTS:

Ascending Aorta: 34 mm

Descending Aorta: 25 mm

EXTRACARDIAC FINDINGS:

3 mm left lower lobe nodule (axial image 29 of series [DATE] x 4 mm
(mean diameter of 6 mm) sessile nodule along the posterior aspect of
the superior segment of the right lower lobe (axial image 23 of
series 9). Patchy areas of ground-glass attenuation are also noted
the medial aspect of the right lower lobe. Within the visualized
portions of the thorax there are no suspicious appearing pulmonary
nodules or masses, there is no acute consolidative airspace disease,
no pleural effusions, no pneumothorax and no lymphadenopathy.
Visualized portions of the upper abdomen are unremarkable. There are
no aggressive appearing lytic or blastic lesions noted in the
visualized portions of the skeleton.
IMPRESSION: 1. Patient's total coronary artery calcium score is 40, which is
89th percentile for patient's of matched age, gender and
race/ethnicity. Please note that although the presence of coronary
artery calcium documents the presence of coronary artery disease,
the severity of this disease and any potential stenosis cannot be
assessed on this noncontrast CT examination. Assessment for
potential risk factor modification, dietary therapy or pharmacologic
therapy may be warranted, if clinically indicated.
2. Multiple small pulmonary nodules, largest of which has a mean
diameter of 6 mm. Non-contrast chest CT at 6 months is recommended.
If the nodules are stable at time of repeat CT, then future CT at
18-24 months (from today's scan) is considered optional for low-risk
patients, but is recommended for high-risk patients. This
recommendation follows the consensus statement: Guidelines for
Management of Incidental Pulmonary Nodules Detected on CT Images:
3. Patchy ground-glass attenuation in the medial aspect of the right
lower lobe, favored to be of infectious or inflammatory etiology.
Attention at time of repeat noncontrast chest CT is recommended to
ensure stability or regression.

## 2023-04-20 ENCOUNTER — Ambulatory Visit
Admission: RE | Admit: 2023-04-20 | Discharge: 2023-04-20 | Disposition: A | Discharge status: Home / Self Care | Payer: BC Managed Care – PPO | Source: Ambulatory Visit | Attending: Emergency Medicine | Admitting: Emergency Medicine

## 2023-04-20 DIAGNOSIS — R9389 Abnormal findings on diagnostic imaging of other specified body structures: Secondary | ICD-10-CM

## 2023-04-20 DIAGNOSIS — R911 Solitary pulmonary nodule: Secondary | ICD-10-CM | POA: Diagnosis not present

## 2023-04-29 DIAGNOSIS — E118 Type 2 diabetes mellitus with unspecified complications: Secondary | ICD-10-CM | POA: Diagnosis not present

## 2023-04-29 DIAGNOSIS — Z0000 Encounter for general adult medical examination without abnormal findings: Secondary | ICD-10-CM | POA: Diagnosis not present

## 2023-05-04 DIAGNOSIS — Z23 Encounter for immunization: Secondary | ICD-10-CM | POA: Diagnosis not present

## 2023-05-04 DIAGNOSIS — R918 Other nonspecific abnormal finding of lung field: Secondary | ICD-10-CM | POA: Diagnosis not present

## 2023-05-04 DIAGNOSIS — Z0000 Encounter for general adult medical examination without abnormal findings: Secondary | ICD-10-CM | POA: Diagnosis not present

## 2023-05-04 DIAGNOSIS — S80812A Abrasion, left lower leg, initial encounter: Secondary | ICD-10-CM | POA: Diagnosis not present

## 2023-05-04 DIAGNOSIS — E7800 Pure hypercholesterolemia, unspecified: Secondary | ICD-10-CM | POA: Diagnosis not present

## 2023-05-06 ENCOUNTER — Telehealth: Payer: Self-pay | Admitting: Emergency Medicine

## 2023-05-06 NOTE — Telephone Encounter (Signed)
Discussed CT chest with the patient by phone.  Her pulmonary nodule is stable in size and appearance, has been followed for 2 years.  Can be deemed benign.  She does not need any further follow-up.   Please cancel the patient's upcoming office visit.  Thank you

## 2023-05-27 ENCOUNTER — Ambulatory Visit: Payer: BC Managed Care – PPO | Admitting: Emergency Medicine

## 2023-06-21 DIAGNOSIS — J019 Acute sinusitis, unspecified: Secondary | ICD-10-CM | POA: Diagnosis not present

## 2023-06-21 DIAGNOSIS — B9689 Other specified bacterial agents as the cause of diseases classified elsewhere: Secondary | ICD-10-CM | POA: Diagnosis not present

## 2023-06-24 DIAGNOSIS — B9689 Other specified bacterial agents as the cause of diseases classified elsewhere: Secondary | ICD-10-CM | POA: Diagnosis not present

## 2023-06-24 DIAGNOSIS — J019 Acute sinusitis, unspecified: Secondary | ICD-10-CM | POA: Diagnosis not present

## 2023-07-05 DIAGNOSIS — I10 Essential (primary) hypertension: Secondary | ICD-10-CM | POA: Diagnosis not present

## 2023-07-05 DIAGNOSIS — J209 Acute bronchitis, unspecified: Secondary | ICD-10-CM | POA: Diagnosis not present

## 2023-08-10 DIAGNOSIS — E118 Type 2 diabetes mellitus with unspecified complications: Secondary | ICD-10-CM | POA: Diagnosis not present

## 2023-08-10 DIAGNOSIS — Z0000 Encounter for general adult medical examination without abnormal findings: Secondary | ICD-10-CM | POA: Diagnosis not present

## 2023-08-15 DIAGNOSIS — I10 Essential (primary) hypertension: Secondary | ICD-10-CM | POA: Diagnosis not present

## 2023-08-15 DIAGNOSIS — E7800 Pure hypercholesterolemia, unspecified: Secondary | ICD-10-CM | POA: Diagnosis not present

## 2023-08-15 DIAGNOSIS — E1165 Type 2 diabetes mellitus with hyperglycemia: Secondary | ICD-10-CM | POA: Diagnosis not present

## 2023-08-15 DIAGNOSIS — I700 Atherosclerosis of aorta: Secondary | ICD-10-CM | POA: Diagnosis not present

## 2023-08-23 DIAGNOSIS — L810 Postinflammatory hyperpigmentation: Secondary | ICD-10-CM | POA: Diagnosis not present

## 2023-08-23 DIAGNOSIS — D2262 Melanocytic nevi of left upper limb, including shoulder: Secondary | ICD-10-CM | POA: Diagnosis not present

## 2023-08-23 DIAGNOSIS — D2261 Melanocytic nevi of right upper limb, including shoulder: Secondary | ICD-10-CM | POA: Diagnosis not present

## 2023-08-23 DIAGNOSIS — D225 Melanocytic nevi of trunk: Secondary | ICD-10-CM | POA: Diagnosis not present

## 2023-09-05 ENCOUNTER — Institutional Professional Consult (permissible substitution) (INDEPENDENT_AMBULATORY_CARE_PROVIDER_SITE_OTHER): Payer: BC Managed Care – PPO

## 2023-10-26 DIAGNOSIS — J3489 Other specified disorders of nose and nasal sinuses: Secondary | ICD-10-CM | POA: Diagnosis not present

## 2023-10-26 DIAGNOSIS — R051 Acute cough: Secondary | ICD-10-CM | POA: Diagnosis not present

## 2023-11-15 DIAGNOSIS — H2513 Age-related nuclear cataract, bilateral: Secondary | ICD-10-CM | POA: Diagnosis not present

## 2023-11-15 DIAGNOSIS — E113393 Type 2 diabetes mellitus with moderate nonproliferative diabetic retinopathy without macular edema, bilateral: Secondary | ICD-10-CM | POA: Diagnosis not present

## 2023-11-15 DIAGNOSIS — H52203 Unspecified astigmatism, bilateral: Secondary | ICD-10-CM | POA: Diagnosis not present

## 2023-11-15 DIAGNOSIS — Z79899 Other long term (current) drug therapy: Secondary | ICD-10-CM | POA: Diagnosis not present

## 2023-11-15 DIAGNOSIS — H43813 Vitreous degeneration, bilateral: Secondary | ICD-10-CM | POA: Diagnosis not present

## 2023-11-15 DIAGNOSIS — H524 Presbyopia: Secondary | ICD-10-CM | POA: Diagnosis not present

## 2023-11-15 DIAGNOSIS — H5213 Myopia, bilateral: Secondary | ICD-10-CM | POA: Diagnosis not present

## 2023-11-17 DIAGNOSIS — M1990 Unspecified osteoarthritis, unspecified site: Secondary | ICD-10-CM | POA: Diagnosis not present

## 2023-11-17 DIAGNOSIS — Z79899 Other long term (current) drug therapy: Secondary | ICD-10-CM | POA: Diagnosis not present

## 2023-11-17 DIAGNOSIS — R768 Other specified abnormal immunological findings in serum: Secondary | ICD-10-CM | POA: Diagnosis not present

## 2023-11-17 DIAGNOSIS — M0609 Rheumatoid arthritis without rheumatoid factor, multiple sites: Secondary | ICD-10-CM | POA: Diagnosis not present

## 2023-11-18 ENCOUNTER — Other Ambulatory Visit (HOSPITAL_BASED_OUTPATIENT_CLINIC_OR_DEPARTMENT_OTHER): Payer: Self-pay

## 2023-11-18 ENCOUNTER — Other Ambulatory Visit (HOSPITAL_COMMUNITY): Payer: Self-pay

## 2023-11-18 MED ORDER — MOUNJARO 10 MG/0.5ML ~~LOC~~ SOAJ
10.0000 mg | SUBCUTANEOUS | 5 refills | Status: AC
  Filled 2023-11-18: qty 2, 28d supply, fill #0
  Filled 2023-12-22 – 2024-01-23 (×2): qty 2, 28d supply, fill #1
  Filled 2024-04-24: qty 2, 28d supply, fill #2
  Filled 2024-06-09 – 2024-07-18 (×3): qty 2, 28d supply, fill #3

## 2023-11-21 ENCOUNTER — Other Ambulatory Visit (HOSPITAL_COMMUNITY): Payer: Self-pay

## 2023-11-25 ENCOUNTER — Encounter (HOSPITAL_COMMUNITY): Payer: Self-pay

## 2023-11-25 ENCOUNTER — Emergency Department (HOSPITAL_COMMUNITY): Payer: Self-pay

## 2023-11-25 ENCOUNTER — Emergency Department (HOSPITAL_COMMUNITY)
Admission: EM | Admit: 2023-11-25 | Discharge: 2023-11-25 | Disposition: A | Discharge status: Home / Self Care | Payer: Self-pay | Attending: Emergency Medicine | Admitting: Emergency Medicine

## 2023-11-25 ENCOUNTER — Other Ambulatory Visit: Payer: Self-pay

## 2023-11-25 DIAGNOSIS — I10 Essential (primary) hypertension: Secondary | ICD-10-CM | POA: Diagnosis not present

## 2023-11-25 DIAGNOSIS — R0789 Other chest pain: Secondary | ICD-10-CM | POA: Insufficient documentation

## 2023-11-25 DIAGNOSIS — R1011 Right upper quadrant pain: Secondary | ICD-10-CM | POA: Diagnosis not present

## 2023-11-25 DIAGNOSIS — E119 Type 2 diabetes mellitus without complications: Secondary | ICD-10-CM | POA: Insufficient documentation

## 2023-11-25 DIAGNOSIS — Z7982 Long term (current) use of aspirin: Secondary | ICD-10-CM | POA: Diagnosis not present

## 2023-11-25 DIAGNOSIS — K760 Fatty (change of) liver, not elsewhere classified: Secondary | ICD-10-CM | POA: Diagnosis not present

## 2023-11-25 DIAGNOSIS — R079 Chest pain, unspecified: Secondary | ICD-10-CM | POA: Diagnosis not present

## 2023-11-25 DIAGNOSIS — R739 Hyperglycemia, unspecified: Secondary | ICD-10-CM | POA: Diagnosis not present

## 2023-11-25 DIAGNOSIS — K828 Other specified diseases of gallbladder: Secondary | ICD-10-CM | POA: Diagnosis not present

## 2023-11-25 LAB — TROPONIN I (HIGH SENSITIVITY)
Troponin I (High Sensitivity): 5 ng/L (ref <18)
Troponin I (High Sensitivity): 6 ng/L (ref <18)

## 2023-11-25 LAB — CBC WITH DIFFERENTIAL/PLATELET
Abs Immature Granulocytes: 0.02 10*3/uL (ref 0.00–0.07)
Basophils Absolute: 0.1 10*3/uL (ref 0.0–0.1)
Basophils Relative: 1 %
Eosinophils Absolute: 0.3 10*3/uL (ref 0.0–0.5)
Eosinophils Relative: 4 %
HCT: 42.3 % (ref 36.0–46.0)
Hemoglobin: 14.5 g/dL (ref 12.0–15.0)
Immature Granulocytes: 0 %
Lymphocytes Relative: 32 %
Lymphs Abs: 2 10*3/uL (ref 0.7–4.0)
MCH: 31.3 pg (ref 26.0–34.0)
MCHC: 34.3 g/dL (ref 30.0–36.0)
MCV: 91.4 fL (ref 80.0–100.0)
Monocytes Absolute: 0.5 10*3/uL (ref 0.1–1.0)
Monocytes Relative: 8 %
Neutro Abs: 3.5 10*3/uL (ref 1.7–7.7)
Neutrophils Relative %: 55 %
Platelets: 184 10*3/uL (ref 150–400)
RBC: 4.63 MIL/uL (ref 3.87–5.11)
RDW: 12.6 % (ref 11.5–15.5)
WBC: 6.4 10*3/uL (ref 4.0–10.5)
nRBC: 0 % (ref 0.0–0.2)

## 2023-11-25 LAB — COMPREHENSIVE METABOLIC PANEL WITH GFR
ALT: 24 U/L (ref 0–44)
AST: 24 U/L (ref 15–41)
Albumin: 4.2 g/dL (ref 3.5–5.0)
Alkaline Phosphatase: 62 U/L (ref 38–126)
Anion gap: 13 (ref 5–15)
BUN: 11 mg/dL (ref 6–20)
CO2: 22 mmol/L (ref 22–32)
Calcium: 9.7 mg/dL (ref 8.9–10.3)
Chloride: 101 mmol/L (ref 98–111)
Creatinine, Ser: 0.59 mg/dL (ref 0.44–1.00)
GFR, Estimated: >60 mL/min (ref >60)
Glucose, Bld: 278 mg/dL — ABNORMAL HIGH (ref 70–99)
Potassium: 3.5 mmol/L (ref 3.5–5.1)
Sodium: 136 mmol/L (ref 135–145)
Total Bilirubin: 0.9 mg/dL (ref 0.0–1.2)
Total Protein: 7.3 g/dL (ref 6.5–8.1)

## 2023-11-25 LAB — LIPASE, BLOOD: Lipase: 46 U/L (ref 11–51)

## 2023-11-25 MED ORDER — LIDOCAINE VISCOUS HCL 2 % MT SOLN: 15.0000 mL | Freq: Once | OROMUCOSAL | Status: DC

## 2023-11-25 MED ORDER — PANTOPRAZOLE SODIUM 20 MG PO TBEC: 20.0000 mg | DELAYED_RELEASE_TABLET | Freq: Every day | ORAL | 0 refills | Status: AC

## 2023-11-25 MED ORDER — MAALOX MAX 400-400-40 MG/5ML PO SUSP
10.0000 mL | Freq: Four times a day (QID) | ORAL | 0 refills | Status: DC | PRN
Start: 2023-11-25 — End: 2024-02-27

## 2023-11-25 MED ORDER — PANTOPRAZOLE SODIUM 40 MG IV SOLR
40.0000 mg | Freq: Once | INTRAVENOUS | Status: AC
  Administered 2023-11-25: 40 mg via INTRAVENOUS
  Filled 2023-11-25: qty 10

## 2023-11-25 MED ORDER — ALUM & MAG HYDROXIDE-SIMETH 200-200-20 MG/5ML PO SUSP
30.0000 mL | Freq: Once | ORAL | Status: AC
  Administered 2023-11-25: 30 mL via ORAL
  Filled 2023-11-25: qty 30

## 2023-11-25 NOTE — ED Triage Notes (Signed)
 PT arrived via EMS PT states chest pain and she took ASA 324mg  at home and states she had 3 16oz beer tonight as well as 2 THC gummies. PT is A&O X4. PT states no pain now and thinks she had GERD. PT has a history of HTN PT BP was elevated and all other VSS

## 2023-11-25 NOTE — Discharge Instructions (Signed)

## 2023-11-25 NOTE — ED Provider Notes (Signed)
 Emergency Department Provider Note   I have reviewed the triage vital signs and the nursing notes.   HISTORY  Chief Complaint Chest Pain   HPI [name] is a 58 y.o. female with past history of hypertension, diabetes, alcohol use presents to the emergency department with chest pain.  Symptoms became moderate to severe tonight.  Describes a burning type sensation just right of center in the chest.  Some mild epigastric discomfort as well.  No vomiting.  No fevers.  No shortness of breath.  Patient tells me symptoms were severe for an hour, improved slightly but continues to have moderate discomfort in the ED.  She called 911 and was instructed to take an aspirin which she did.  He states that she has had several beers this evening along with 2 THC Gummies.  She has had GERD/PUD in the past and was wondering if this could be the cause tonight.    Past Medical History:  Diagnosis Date   Abnormal pap    Anxiety    Depression    Deviated septum 1984   Fibroids    h/o   Groin pain    h/o   Hypertension    Type II diabetes mellitus (HCC)     Review of Systems  Constitutional: No fever/chills Cardiovascular: Positive chest pain. Respiratory: Denies shortness of breath. Gastrointestinal: Positive abdominal pain. Mild nausea, no vomiting.  No diarrhea.  No constipation. Skin: Negative for rash. Neurological: Negative for headache.  ____________________________________________   PHYSICAL EXAM:  VITAL SIGNS: ED Triage Vitals [11/25/23 0326]  Encounter Vitals Group     BP (!) 193/100     Pulse Rate 81     Resp 18     Temp 97.8 F (36.6 C)     Temp src      SpO2 100 %     Weight 170 lb (77.1 kg)     Height 5' (1.524 m)   Constitutional: Alert and oriented. Well appearing and in no acute distress. Eyes: Conjunctivae are normal.  Head: Atraumatic. Nose: No congestion/rhinnorhea. Mouth/Throat: Mucous membranes are moist.  Neck: No stridor.   Cardiovascular:  Normal rate, regular rhythm. Good peripheral circulation. Grossly normal heart sounds.   Respiratory: Normal respiratory effort.  No retractions. Lungs CTAB. Gastrointestinal: Soft with mid-epigastric and RRUQ tenderness on exam. Non-tender lower abdomen. No distention.  Musculoskeletal: No lower extremity tenderness nor edema. No gross deformities of extremities. Neurologic:  Normal speech and language.    ____________________________________________   LABS (all labs ordered are listed, but only abnormal results are displayed)  Labs Reviewed  COMPREHENSIVE METABOLIC PANEL WITH GFR - Abnormal; Notable for the following components:      Result Value   Glucose, Bld 278 (*)    All other components within normal limits  LIPASE, BLOOD  CBC WITH DIFFERENTIAL/PLATELET  TROPONIN I (HIGH SENSITIVITY)  TROPONIN I (HIGH SENSITIVITY)   ____________________________________________  EKG   EKG Interpretation Date/Time:  Friday November 25 2023 03:28:57 EDT Ventricular Rate:  82 PR Interval:  169 QRS Duration:  103 QT Interval:  407 QTC Calculation: 476 R Axis:   66  Text Interpretation: Sinus rhythm Confirmed by Abby Hocking [phone]) on 11/25/2023 3:47:06 AM        ____________________________________________  RADIOLOGY  US  Abdomen Limited RUQ (LIVER/GB) Result Date: 11/25/2023 EXAM: US  Abdomen Limited, Right Upper Quadrant. CLINICAL HISTORY: RUQ abdominal pain. TECHNIQUE: Real-time ultrasound of the right upper quadrant with image documentation. COMPARISON: None provided. FINDINGS: LIVER: The liver  is diffusely echogenic. No discrete lesions are seen. GALLBLADDER: Echogenic layering debris, likely sludge, is present in the gallbladder. No discrete stones are present. The gallbladder wall is mildly thickened at 3.3 mm. COMMON BILE DUCT: The common bile duct is within normal limits at 3.6 mm. IMPRESSION: 1. Echogenic layering debris, likely sludge, in the gallbladder with mild wall thickening  (3.3 mm). No discrete stones. No evidence for cholecystitis. 2. Hepatic steatosis. No discrete hepatic lesions. Electronically signed by: Audree Leas MD 11/25/2023 04:56 AM EDT RP Workstation: [id number]   DG Chest 2 View Result Date: 11/25/2023 CLINICAL DATA:  Chest pain. EXAM: CHEST - 2 VIEW COMPARISON:  PA Lat 01/31/2016 FINDINGS: The heart size and mediastinal contours are within normal limits. Both lungs are clear. The visualized skeletal structures are unremarkable. IMPRESSION: No active cardiopulmonary disease.  Unchanged. Electronically Signed   By: Denman Fischer M.D.   On: 11/25/2023 04:07    ____________________________________________   PROCEDURES  Procedure(s) performed:   Procedures  None  ____________________________________________   INITIAL IMPRESSION / ASSESSMENT AND PLAN / ED COURSE  Pertinent labs & imaging results that were available during my care of the patient were reviewed by me and considered in my medical decision making (see chart for details).   This patient is Presenting for Evaluation of epigastric abdominal pain, which does require a range of treatment options, and is a complaint that involves a high risk of morbidity and mortality.  The Differential Diagnoses includes but is not exclusive to acute coronary syndrome, aortic dissection, pulmonary embolism, cardiac tamponade, community-acquired pneumonia, pericarditis, musculoskeletal chest wall pain, etc.   Critical Interventions-    Medications  alum & mag hydroxide-simeth (MAALOX/MYLANTA) 200-200-20 MG/5ML suspension 30 mL (30 mLs Oral Given 11/25/23 0400)  pantoprazole (PROTONIX) injection 40 mg (40 mg Intravenous Given 11/25/23 0400)    Reassessment after intervention:  pain resolved.     Clinical Laboratory Tests Ordered, included troponin negative x 2. Normal LFTs.   Radiologic Tests Ordered, included CXR and RUQ US . I independently interpreted the images and agree with radiology  interpretation.   Cardiac Monitor Tracing which shows NSR.    Social Determinants of Health Risk Positive EtoH and THC use.   Medical Decision Making: Summary:  Patient presents emergency department for evaluation of chest pain.  Fairly atypical and seems GI in nature.  Fairly broad differential as above.  Plan for right upper quadrant ultrasound in addition to chest x-ray given abdominal tenderness.  Labs, GI cocktail, reassess.  Reevaluation with update and discussion with patient.  No evidence of acute cholecystitis on ultrasound.  Chest x-ray clear.  Troponin normal.    Patient's presentation is most consistent with acute presentation with potential threat to life or bodily function.   Disposition: discharge  ____________________________________________  FINAL CLINICAL IMPRESSION(S) / ED DIAGNOSES  Final diagnoses:  Atypical chest pain     NEW OUTPATIENT MEDICATIONS STARTED DURING THIS VISIT:  New Prescriptions   ALUM & MAG HYDROXIDE-SIMETH (MAALOX MAX) 400-400-40 MG/5ML SUSPENSION    Take 10 mLs by mouth every 6 (six) hours as needed for indigestion.   PANTOPRAZOLE (PROTONIX) 20 MG TABLET    Take 1 tablet (20 mg total) by mouth daily.    Note:  This document was prepared using Dragon voice recognition software and may include unintentional dictation errors.  Abby Hocking, MD, [facility] Emergency Medicine    Lyden Redner, Shereen Dike, MD 11/25/23 [phone]

## 2023-12-06 ENCOUNTER — Ambulatory Visit (INDEPENDENT_AMBULATORY_CARE_PROVIDER_SITE_OTHER): Admitting: Otolaryngology

## 2023-12-06 ENCOUNTER — Encounter (INDEPENDENT_AMBULATORY_CARE_PROVIDER_SITE_OTHER): Payer: Self-pay | Admitting: Otolaryngology

## 2023-12-06 VITALS — BP 156/88 | HR 89 | Ht 59.0 in | Wt 166.0 lb

## 2023-12-06 DIAGNOSIS — J310 Chronic rhinitis: Secondary | ICD-10-CM | POA: Diagnosis not present

## 2023-12-06 DIAGNOSIS — J338 Other polyp of sinus: Secondary | ICD-10-CM | POA: Diagnosis not present

## 2023-12-06 DIAGNOSIS — J343 Hypertrophy of nasal turbinates: Secondary | ICD-10-CM | POA: Insufficient documentation

## 2023-12-06 DIAGNOSIS — J324 Chronic pansinusitis: Secondary | ICD-10-CM | POA: Insufficient documentation

## 2023-12-06 MED ORDER — FLUTICASONE PROPIONATE 50 MCG/ACT NA SUSP: 2.0000 | Freq: Every day | NASAL | 10 refills | Status: DC

## 2023-12-06 NOTE — Progress Notes (Signed)
 CC: Recurrent sinus infections, chronic nasal obstruction  HPI:  [name] is a 58 y.o. female who presents today for her initial patient consultation. - Presents to establish care with an ENT specialist due to a history of recurrent sinus infections and chronic nasal obstruction. Reports feeling as though there is a persistent colony of infection within the sinuses. - This issue has been ongoing for over 30 years, since the 1990s. - When infections occur, symptoms include nasal stuffiness, facial pressure, nasal congestion, significant nasal drainage, and cough. Denies significant facial pain or headaches. - Reports that antibiotics typically resolve the infections. About a month ago, presented to a primary care physician for a suspected sinus infection but was diagnosed with allergies and not prescribed antibiotics. The symptoms lingered for a few weeks and then improved with a change in weather. - Reports a history of seasonal environmental allergies, which were more severe when younger but have improved with age. Uses OTC allergy medication, such as Zyrtec, on an as-needed basis. - Past surgical history includes a septoplasty for a deviated septum at age 78. Denies any other ear, nose, or throat surgeries. - Medical history is significant for type 2 diabetes mellitus, managed with injections of Tresiba and Mounjaro . - Family history is positive for glaucoma.  Past Medical History:  Diagnosis Date   Abnormal pap    Anxiety    Depression    Deviated septum 1984   Fibroids    h/o   Groin pain    h/o   Hypertension    Type II diabetes mellitus (HCC)     Past Surgical History:  Procedure Laterality Date   CESAREAN SECTION      Family History  Problem Relation Age of Onset   Scoliosis Brother    Anxiety disorder Brother    Depression Brother    Angina Maternal Grandmother    Hypertension Maternal Grandmother    Anxiety disorder Maternal Grandmother    Depression Maternal  Grandmother    Atrial fibrillation Father    Hypertension Father    Stroke Father    Bipolar disorder Father    Colon cancer Maternal Grandfather    Anxiety disorder Maternal Grandfather    Depression Maternal Grandfather    Depression Mother    Anxiety disorder Mother    Suicidality Paternal Grandfather     Social History:  reports that she has never smoked. She has never used smokeless tobacco. She reports current alcohol use of about 10.0 - 12.0 standard drinks of alcohol per week. She reports that she does not use drugs.  Allergies:  Allergies  Allergen Reactions   Canagliflozin Other (See Comments)    Prior to Admission medications   Medication Sig Start Date End Date Taking? Authorizing Provider  ALPRAZolam (XANAX) 0.5 MG tablet Take 0.5 mg by mouth at bedtime as needed.   Yes [provider]  alum & mag hydroxide-simeth (MAALOX MAX) 400-400-40 MG/5ML suspension Take 10 mLs by mouth every 6 (six) hours as needed for indigestion. 11/25/23  Yes Long, Joshua G, MD  Amlodipine-Olmesartan (AZOR PO) Take by mouth.   Yes [provider]  aspirin EC 81 MG tablet Take 81 mg by mouth daily. Swallow whole.   Yes [provider]  ATORVASTATIN CALCIUM PO Take by mouth.   Yes [provider]  bisoprolol (ZEBETA) 10 MG tablet TAKE ONE tablet Orally ONCE a DAY   Yes [provider]  clonazePAM (KLONOPIN) 0.5 MG tablet Take 1 tablet by mouth daily.  07/16/20  Yes [provider]  empagliflozin (JARDIANCE) 25 MG TABS tablet Take 1 tablet by mouth daily.   Yes [provider]  glucose blood test strip    Yes [provider]  losartan (COZAAR) 50 MG tablet Take 50 mg by mouth daily.   Yes [provider]  metoprolol succinate (TOPROL-XL) 25 MG 24 hr tablet Take 25 mg by mouth daily.   Yes [provider]  naproxen (NAPROSYN) 500 MG tablet SMARTSIG:1 Tablet(s) By Mouth Every 12 Hours PRN   Yes [provider]  pantoprazole  (PROTONIX ) 20 MG tablet Take 1 tablet (20 mg total) by mouth daily. 11/25/23 12/25/23 Yes Long, Fonda MATSU, MD  tirzepatide  (MOUNJARO ) 10 MG/0.5ML Pen Inject 10 mg into the skin once a week. 11/17/23  Yes   Dulaglutide (TRULICITY [place]) Inject into the skin. Patient not taking: Reported on 12/06/2023    [provider]  hydroxychloroquine (PLAQUENIL) 200 MG tablet TAKE TWO TABLETS BY MOUTH DAILY AS DIRECTED Patient not taking: Reported on 12/06/2023    [provider]  losartan-hydrochlorothiazide (HYZAAR) 100-25 MG tablet TAKE 1 tablet Orally Once a day. Patient not taking: Reported on 12/06/2023    [provider]  metFORMIN (GLUMETZA) 500 MG (MOD) 24 hr tablet Take 500 mg by mouth daily. Patient not taking: Reported on 12/06/2023    [provider]  OZEMPIC, 1 MG/DOSE, 4 MG/3ML SOPN Inject 1 mg into the skin once a week. Patient not taking: Reported on 12/06/2023 04/03/22   [provider]  promethazine-dextromethorphan (PROMETHAZINE-DM) 6.25-15 MG/5ML syrup Take by mouth. Patient not taking: Reported on 12/06/2023 11/07/15   [provider]    Blood pressure (!) 156/88, pulse 89, height 4' 11 (1.499 m), weight 166 lb (75.3 kg), SpO2 96%. Exam: General: Communicates without difficulty, well nourished, no acute distress. Head: Normocephalic, no evidence injury, no tenderness, facial buttresses intact without stepoff. Face/sinus: No tenderness to palpation and percussion. Facial movement is normal and symmetric. Eyes: PERRL, EOMI. No scleral icterus, conjunctivae clear. Neuro: CN II exam reveals vision grossly intact.  No nystagmus at any point of gaze. Ears: Auricles well formed without lesions.  Ear canals are intact without mass or lesion.  No erythema or edema is appreciated.  The TMs are intact without fluid. Nose: External evaluation reveals normal support and skin without lesions.  Dorsum is intact.  Anterior rhinoscopy  reveals congested mucosa over anterior aspect of inferior turbinates and intact septum.  No purulence noted. Oral:  Oral cavity and oropharynx are intact, symmetric, without erythema or edema.  Mucosa is moist without lesions. Neck: Full range of motion without pain.  There is no significant lymphadenopathy.  No masses palpable.  Thyroid  bed within normal limits to palpation.  Parotid glands and submandibular glands equal bilaterally without mass.  Trachea is midline. Neuro:  CN 2-12 grossly intact.   Procedure:  Flexible Nasal Endoscopy: Description: Risks, benefits, and alternatives of flexible endoscopy were explained to the patient.  Specific mention was made of the risk of throat numbness with difficulty swallowing, possible bleeding from the nose and mouth, and pain from the procedure.  The patient gave oral consent to proceed.  The flexible scope was inserted into the right nasal cavity.  Endoscopy of the interior nasal cavity, superior, inferior, and middle meatus was performed. The sphenoid-ethmoid recess was examined. Edematous mucosa was noted.  Large polypoid tissue was noted to emanate from the middle meatus, completely obstructing the posterior nasal cavity.  Nasopharynx  was clear.  Turbinates were hypertrophied but without mass.  The procedure was repeated on the contralateral side with similar findings.  The patient tolerated the procedure well.   Assessment: - Bilateral nasal polyposis causing significant nasal obstruction and likely contributing to recurrent sinusitis due to blockage of sinus drainage pathways. - Bilateral chronic rhinosinusitis, with recurrent exacerbations.  Plan: - A CT scan of the sinuses has been ordered to evaluate the full extent of the polyposis within the sinus cavities. This will help guide surgical planning. - A prescription for Flonase nasal spray has been sent to [facility]. Was instructed to use two sprays in each nostril once daily, every day. Was  counseled on the proper technique, aiming the spray toward the ear on the same side.  - Oral steroids are contraindicated due to the diagnosis of diabetes and the risk of significant hyperglycemia. - Surgical removal of the polyps is the likely course of treatment, as systemic steroid therapy is not an option. The extent of the surgery will be determined by the CT scan findings.  - A follow-up appointment is scheduled in four weeks, after the CT scan has been completed.  Javarie Crisp W Annalaya Wile 12/06/2023, 1:20 PM

## 2023-12-22 ENCOUNTER — Other Ambulatory Visit (HOSPITAL_COMMUNITY): Payer: Self-pay

## 2023-12-27 DIAGNOSIS — R1011 Right upper quadrant pain: Secondary | ICD-10-CM | POA: Diagnosis not present

## 2023-12-27 DIAGNOSIS — K8050 Calculus of bile duct without cholangitis or cholecystitis without obstruction: Secondary | ICD-10-CM | POA: Diagnosis not present

## 2023-12-27 DIAGNOSIS — E119 Type 2 diabetes mellitus without complications: Secondary | ICD-10-CM | POA: Diagnosis not present

## 2023-12-28 ENCOUNTER — Emergency Department (HOSPITAL_COMMUNITY)

## 2023-12-28 ENCOUNTER — Encounter (HOSPITAL_COMMUNITY): Payer: Self-pay

## 2023-12-28 ENCOUNTER — Other Ambulatory Visit: Payer: Self-pay

## 2023-12-28 ENCOUNTER — Emergency Department (HOSPITAL_COMMUNITY)
Admission: EM | Admit: 2023-12-28 | Discharge: 2023-12-28 | Disposition: A | Discharge status: Home / Self Care | Attending: Emergency Medicine | Admitting: Emergency Medicine

## 2023-12-28 DIAGNOSIS — Z7982 Long term (current) use of aspirin: Secondary | ICD-10-CM | POA: Insufficient documentation

## 2023-12-28 DIAGNOSIS — K8050 Calculus of bile duct without cholangitis or cholecystitis without obstruction: Secondary | ICD-10-CM | POA: Insufficient documentation

## 2023-12-28 DIAGNOSIS — R1011 Right upper quadrant pain: Secondary | ICD-10-CM | POA: Diagnosis not present

## 2023-12-28 LAB — URINALYSIS, ROUTINE W REFLEX MICROSCOPIC
Bilirubin Urine: NEGATIVE
Glucose, UA: >=500 mg/dL — AB
Hgb urine dipstick: NEGATIVE
Ketones, ur: NEGATIVE mg/dL
Leukocytes,Ua: NEGATIVE
Nitrite: NEGATIVE
Protein, ur: NEGATIVE mg/dL
Specific Gravity, Urine: 1.033 — ABNORMAL HIGH (ref 1.005–1.030)
pH: 5 (ref 5.0–8.0)

## 2023-12-28 LAB — CBC WITH DIFFERENTIAL/PLATELET
Abs Immature Granulocytes: 0.02 K/uL (ref 0.00–0.07)
Basophils Absolute: 0.1 K/uL (ref 0.0–0.1)
Basophils Relative: 1 %
Eosinophils Absolute: 0.2 K/uL (ref 0.0–0.5)
Eosinophils Relative: 4 %
HCT: 40.8 % (ref 36.0–46.0)
Hemoglobin: 13.8 g/dL (ref 12.0–15.0)
Immature Granulocytes: 0 %
Lymphocytes Relative: 25 %
Lymphs Abs: 1.6 K/uL (ref 0.7–4.0)
MCH: 31.4 pg (ref 26.0–34.0)
MCHC: 33.8 g/dL (ref 30.0–36.0)
MCV: 92.9 fL (ref 80.0–100.0)
Monocytes Absolute: 0.4 K/uL (ref 0.1–1.0)
Monocytes Relative: 7 %
Neutro Abs: 4 K/uL (ref 1.7–7.7)
Neutrophils Relative %: 63 %
Platelets: 178 K/uL (ref 150–400)
RBC: 4.39 MIL/uL (ref 3.87–5.11)
RDW: 12 % (ref 11.5–15.5)
WBC: 6.3 K/uL (ref 4.0–10.5)
nRBC: 0 % (ref 0.0–0.2)

## 2023-12-28 LAB — COMPREHENSIVE METABOLIC PANEL WITH GFR
ALT: 24 U/L (ref 0–44)
AST: 23 U/L (ref 15–41)
Albumin: 4.1 g/dL (ref 3.5–5.0)
Alkaline Phosphatase: 69 U/L (ref 38–126)
Anion gap: 12 (ref 5–15)
BUN: 17 mg/dL (ref 6–20)
CO2: 23 mmol/L (ref 22–32)
Calcium: 9.5 mg/dL (ref 8.9–10.3)
Chloride: 105 mmol/L (ref 98–111)
Creatinine, Ser: 0.58 mg/dL (ref 0.44–1.00)
GFR, Estimated: >60 mL/min (ref >60)
Glucose, Bld: 153 mg/dL — ABNORMAL HIGH (ref 70–99)
Potassium: 3.8 mmol/L (ref 3.5–5.1)
Sodium: 140 mmol/L (ref 135–145)
Total Bilirubin: 0.8 mg/dL (ref 0.0–1.2)
Total Protein: 7 g/dL (ref 6.5–8.1)

## 2023-12-28 LAB — LIPASE, BLOOD: Lipase: 43 U/L (ref 11–51)

## 2023-12-28 MED ORDER — OXYCODONE-ACETAMINOPHEN 5-325 MG PO TABS
1.0000 | ORAL_TABLET | Freq: Four times a day (QID) | ORAL | 0 refills | Status: DC | PRN
Start: 2023-12-28 — End: 2024-02-27

## 2023-12-28 NOTE — ED Provider Notes (Signed)
 [facility]ERGENC[facility] DEPARTMENT AT [facility] Provider Note   CSN: [id number] Arrival date & time: 12/28/23  1239     Patient presents with: Abdominal Pain   [name] is a 58 y.o. female.   58 year old female presents with 2 days of right upper quadrant abdominal pain.  Pain is characterized as colicky and located to right upper quadrant going to her back.  No fever or chills.  No nausea or vomiting.  Pain is primo subsided after she took Naprosyn.  Patient seen in urgent care 2 days ago for similar symptoms and was to have an outpatient ultrasound which has not been scheduled yet.  Patient does admit to drinking 1 bottle of wine a night       Prior to Admission medications   Medication Sig Start Date End Date Taking? Authorizing Provider  ALPRAZolam (XANAX) 0.5 MG tablet Take 0.5 mg by mouth at bedtime as needed.    [provider]  alum & mag hydroxide-simeth (MAALOX MAX) 400-400-40 MG/5ML suspension Take 10 mLs by mouth every 6 (six) hours as needed for indigestion. 11/25/23   Long, Joshua G, MD  Amlodipine-Olmesartan (AZOR PO) Take by mouth.    [provider]  aspirin EC 81 MG tablet Take 81 mg by mouth daily. Swallow whole.    [provider]  ATORVASTATIN CALCIUM PO Take by mouth.    [provider]  bisoprolol (ZEBETA) 10 MG tablet TAKE ONE tablet Orally ONCE a DAY    [provider]  clonazePAM (KLONOPIN) 0.5 MG tablet Take 1 tablet by mouth daily. 07/16/20   [provider]  Dulaglutide (TRULICITY [place]) Inject into the skin. Patient not taking: Reported on 12/06/2023    [provider]  empagliflozin (JARDIANCE) 25 MG TABS tablet Take 1 tablet by mouth daily.    [provider]  ezetimibe (ZETIA) 10 MG tablet take ONE tablet Orally ONCE a DAy.    [provider]  fluocinonide cream (LIDEX) 0.05 % 4 (four) times daily.    [provider]  fluticasone  (FLONASE ) 50 MCG/ACT  nasal spray Place 2 sprays into both nostrils daily. 12/06/23 01/05/24  Karis Clunes, MD  glucose blood test strip     [provider]  hydroxychloroquine (PLAQUENIL) 200 MG tablet TAKE TWO TABLETS BY MOUTH DAILY AS DIRECTED Patient not taking: Reported on 12/06/2023    [provider]  losartan (COZAAR) 50 MG tablet Take 50 mg by mouth daily.    [provider]  losartan-hydrochlorothiazide (HYZAAR) 100-25 MG tablet TAKE 1 tablet Orally Once a day. Patient not taking: Reported on 12/06/2023    [provider]  metFORMIN (GLUMETZA) 500 MG (MOD) 24 hr tablet Take 500 mg by mouth daily. Patient not taking: Reported on 12/06/2023    [provider]  metoprolol succinate (TOPROL-XL) 25 MG 24 hr tablet Take 25 mg by mouth daily.    [provider]  naproxen (NAPROSYN) 500 MG tablet SMARTSIG:1 Tablet(s) By Mouth Every 12 Hours PRN    [provider]  OZEMPIC, 1 MG/DOSE, 4 MG/3ML SOPN Inject 1 mg into the skin once a week. Patient not taking: Reported on 12/06/2023 04/03/22   [provider]  pantoprazole  (PROTONIX ) 20 MG tablet Take 1 tablet (20 mg total) by mouth daily. 11/25/23 12/25/23  Long, Joshua G, MD  promethazine-dextromethorphan (PROMETHAZINE-DM) 6.25-15 MG/5ML syrup Take by mouth. Patient not taking: Reported on 12/06/2023 11/07/15   [provider]  tirzepatide  (MOUNJARO ) 10 MG/0.5ML  Pen Inject 10 mg into the skin once a week. 11/17/23       Allergies: Canagliflozin    Review of Systems  All other systems reviewed and are negative.   Updated Vital Signs BP (!) 169/99   Pulse 87   Temp 97.8 F (36.6 C)   Resp 17   Ht 1.499 m (4' 11)   Wt 77.1 kg   SpO2 100%   BMI 34.34 kg/m   Physical Exam Vitals and nursing note reviewed.  Constitutional:      General: She is not in acute distress.    Appearance: Normal appearance. She is well-developed. She is not toxic-appearing.  HENT:     Head: Normocephalic and  atraumatic.  Eyes:     General: Lids are normal.     Conjunctiva/sclera: Conjunctivae normal.     Pupils: Pupils are equal, round, and reactive to light.  Neck:     Thyroid : No thyroid  mass.     Trachea: No tracheal deviation.  Cardiovascular:     Rate and Rhythm: Normal rate and regular rhythm.     Heart sounds: Normal heart sounds. No murmur heard.    No gallop.  Pulmonary:     Effort: Pulmonary effort is normal. No respiratory distress.     Breath sounds: Normal breath sounds. No stridor. No decreased breath sounds, wheezing, rhonchi or rales.  Abdominal:     General: There is no distension.     Palpations: Abdomen is soft.     Tenderness: There is abdominal tenderness in the right upper quadrant. There is no guarding or rebound.  Musculoskeletal:        General: No tenderness. Normal range of motion.     Cervical back: Normal range of motion and neck supple.  Skin:    General: Skin is warm and dry.     Findings: No abrasion or rash.  Neurological:     Mental Status: She is alert and oriented to person, place, and time. Mental status is at baseline.     GCS: GCS eye subscore is 4. GCS verbal subscore is 5. GCS motor subscore is 6.     Cranial Nerves: No cranial nerve deficit.     Sensory: No sensory deficit.     Motor: Motor function is intact.  Psychiatric:        Attention and Perception: Attention normal.        Speech: Speech normal.        Behavior: Behavior normal.     (all labs ordered are listed, but only abnormal results are displayed) Labs Reviewed  COMPREHENSIVE METABOLIC PANEL WITH GFR - Abnormal; Notable for the following components:      Result Value   Glucose, Bld 153 (*)    All other components within normal limits  URINALYSIS, ROUTINE W REFLEX MICROSCOPIC - Abnormal; Notable for the following components:   APPearance HAZY (*)    Specific Gravity, Urine 1.033 (*)    Glucose, UA >=500 (*)    Bacteria, UA RARE (*)    All other components within normal  limits  CBC WITH DIFFERENTIAL/PLATELET  LIPASE, BLOOD    EKG: None  Radiology: US  Abdomen Limited RUQ (LIVER/GB) Result Date: 12/28/2023 CLINICAL DATA:  151471 RUQ pain 151471 EXAM: ULTRASOUND ABDOMEN LIMITED RIGHT UPPER QUADRANT COMPARISON:  November 25, 2023 FINDINGS: Gallbladder: Layering biliary sludge with small gallstones measuring up to 1.8 cm. Mild circumferential gallbladder wall thickening. No pericholecystic fluid. No sonographic Murphy's sign noted by sonographer. Common  bile duct: Diameter: 4 mm Liver: Increased echogenicity. No focal lesion identified. No intrahepatic biliary ductal dilation. Portal vein is patent on color Doppler imaging with normal direction of blood flow towards the liver. Right Kidney: Partially visualized. No mass. No hydronephrosis or nephrolithiasis. Other: None. IMPRESSION: 1. Mild gallbladder wall thickening, which in the absence of pericholecystic fluid and a sonographic Murphy's sign, may be due to underdistention, upper abdominal inflammation, hepatitis, or volume overload from either CHF or renal failure. Laboratory correlation recommended. If concern persists for acute cholecystitis, a nonemergent nuclear medicine hepatobiliary scan should be considered. 2. Hepatic steatosis. Electronically Signed   By: Rogelia Myers M.D.   On: 12/28/2023 16:56     Procedures   Medications Ordered in the ED - No data to display                                  Medical Decision Making  Patient's labs here showed no evidence of elevated liver function test.  Lipase is normal.  No white count.  Ultrasound shows mild gallbladder wall thickening.  Patient has only mild tenderness.  Suspect that she has biliary colic.  Will treat with pain medication and give referral to general surgery.     Final diagnoses:  None    ED Discharge Orders     None          Dasie Faden, MD 12/28/23 2110

## 2023-12-28 NOTE — ED Triage Notes (Signed)
 Pt bib POV c/o RUQ pain that started Sunday around midnight. Pt says she went to UC d/t right sided back pain and when the provider palpated her abdomin she jumped up out the chair d/t pain.   The provider referred pt to ER to receive US   Pt denies sob, N/V, fever,

## 2023-12-28 NOTE — ED Triage Notes (Signed)
 Pt to ED reports went to UC yesterday for recurring abdominal pain, was unable  to get an US  so came to ED.

## 2023-12-28 NOTE — ED Provider Triage Note (Signed)
 Emergency Medicine Provider Triage Evaluation Note  [name] , a 57 y.o. female  was evaluated in triage.  Pt complains of right upper quadrant pain intermittently for the past 36 hours.  Was seen in urgent care and encouraged to go get an ultrasound.  Was able to get 1 outpatient so was encouraged come to the ED.  No nausea, vomiting, constipation, diarrhea, fever.  She is unsure what she ate before symptom onset.  Review of Systems  Positive:  Negative:   Physical Exam  BP (!) 167/106 (BP Location: Right Arm)   Pulse 90   Temp 99 F (37.2 C)   Resp 16   Ht 4' 11 (1.499 m)   Wt 77.1 kg   SpO2 95%   BMI 34.34 kg/m  Gen:   Awake, no distress   Resp:  Normal effort  MSK:   Moves extremities without difficulty  Other:  Right upper quadrant tenderness palpation.  Soft.  No guarding or rebound.  Medical Decision Making  Medically screening exam initiated at 3:39 PM.  Appropriate orders placed.  Shriya Schwanz was informed that the remainder of the evaluation will be completed by another provider, this initial triage assessment does not replace that evaluation, and the importance of remaining in the ED until their evaluation is complete.  Right upper quadrant ultrasound and labs ordered.   Bernis Ernst, PA-C 12/28/23 1540

## 2024-01-02 ENCOUNTER — Other Ambulatory Visit (HOSPITAL_COMMUNITY): Payer: Self-pay

## 2024-01-09 DIAGNOSIS — K8020 Calculus of gallbladder without cholecystitis without obstruction: Secondary | ICD-10-CM | POA: Diagnosis not present

## 2024-01-18 ENCOUNTER — Ambulatory Visit (HOSPITAL_BASED_OUTPATIENT_CLINIC_OR_DEPARTMENT_OTHER)

## 2024-01-19 ENCOUNTER — Ambulatory Visit (INDEPENDENT_AMBULATORY_CARE_PROVIDER_SITE_OTHER): Admitting: Otolaryngology

## 2024-01-23 ENCOUNTER — Other Ambulatory Visit (HOSPITAL_COMMUNITY): Payer: Self-pay

## 2024-02-01 DIAGNOSIS — E118 Type 2 diabetes mellitus with unspecified complications: Secondary | ICD-10-CM | POA: Diagnosis not present

## 2024-02-01 DIAGNOSIS — E7800 Pure hypercholesterolemia, unspecified: Secondary | ICD-10-CM | POA: Diagnosis not present

## 2024-02-03 ENCOUNTER — Encounter: Payer: Self-pay | Admitting: Internal Medicine

## 2024-02-07 ENCOUNTER — Telehealth (INDEPENDENT_AMBULATORY_CARE_PROVIDER_SITE_OTHER): Payer: Self-pay | Admitting: Otolaryngology

## 2024-02-07 DIAGNOSIS — E7800 Pure hypercholesterolemia, unspecified: Secondary | ICD-10-CM | POA: Diagnosis not present

## 2024-02-07 DIAGNOSIS — I10 Essential (primary) hypertension: Secondary | ICD-10-CM | POA: Diagnosis not present

## 2024-02-07 DIAGNOSIS — E118 Type 2 diabetes mellitus with unspecified complications: Secondary | ICD-10-CM | POA: Diagnosis not present

## 2024-02-07 NOTE — Telephone Encounter (Signed)
 Left Message - left message returning her call, she reported that she has not had her imaging done yet and is aware we need to move her appt out further. I requested a call back to get that appointment moved to at least 1 week after her imaging is complete.

## 2024-02-15 ENCOUNTER — Ambulatory Visit (INDEPENDENT_AMBULATORY_CARE_PROVIDER_SITE_OTHER): Admitting: Otolaryngology

## 2024-02-23 DIAGNOSIS — M1990 Unspecified osteoarthritis, unspecified site: Secondary | ICD-10-CM | POA: Diagnosis not present

## 2024-02-23 DIAGNOSIS — M0609 Rheumatoid arthritis without rheumatoid factor, multiple sites: Secondary | ICD-10-CM | POA: Diagnosis not present

## 2024-02-23 DIAGNOSIS — R768 Other specified abnormal immunological findings in serum: Secondary | ICD-10-CM | POA: Diagnosis not present

## 2024-02-23 DIAGNOSIS — Z79899 Other long term (current) drug therapy: Secondary | ICD-10-CM | POA: Diagnosis not present

## 2024-02-24 NOTE — Progress Notes (Addendum)
 Surgical Instructions   Your procedure is scheduled on Tuesday, September 16th, 2025. Report to [facility] Main Entrance A at 10:15 A.M., then check in with the Admitting office. Any questions or running late day of surgery: call [phone]  Questions prior to your surgery date: call [phone], Monday-Friday, 8am-4pm. If you experience any cold or flu symptoms such as cough, fever, chills, shortness of breath, etc. between now and your scheduled surgery, please notify us  at the above number.     Remember:  Do not eat after midnight the night before your surgery   You may drink clear liquids until 9:15 the morning of your surgery.   Clear liquids allowed are: Water, Non-Citrus Juices (without pulp), Carbonated Beverages, Clear Tea (no milk, honey, etc.), Black Coffee Only (NO MILK, CREAM OR POWDERED CREAMER of any kind), and Gatorade.   Patient Instructions  The night before surgery:  No food after midnight. ONLY clear liquids after midnight   The day of surgery (if you have diabetes): Drink ONE (1) 12 oz G2 given to you in your pre admission testing appointment by 9:15 the morning of surgery. Drink in one sitting. Do not sip.  This drink was given to you during your hospital  pre-op appointment visit.  Nothing else to drink after completing the  12 oz bottle of G2.         If you have questions, please contact your surgeon's office.     Take these medicines the morning of surgery with A SIP OF WATER: None.     May take these medicines IF NEEDED: Clonazepam (Klonopin)   Follow your surgeon's instructions on when to stop Aspirin.  If no instructions received, reach out to your surgeon's office.     One week prior to surgery, STOP taking any Aleve, Naproxen, Ibuprofen , Motrin , Advil , Goody's, BC's, all herbal medications, fish oil, and non-prescription vitamins.  This includes your Diclofenac Sodium (Voltaren) gel.      WHAT DO I DO ABOUT MY DIABETES  MEDICATION?   Tirzepatide  (Mounjaro ) should be stopped for 7 days prior to surgery.  Your last dose should be on or before Monday, September 8th.  Empagliflozine (Jardiance) should be held for 3 days prior to surgery.  Your last dose should be on Friday, September 12th.    If needed to take insulin degludec Lelon) the night before surgery or morning of surgery, only take 50% of dose.         HOW TO MANAGE YOUR DIABETES BEFORE AND AFTER SURGERY  Why is it important to control my blood sugar before and after surgery? Improving blood sugar levels before and after surgery helps healing and can limit problems. A way of improving blood sugar control is eating a healthy diet by:  Eating less sugar and carbohydrates  Increasing activity/exercise  Talking with your doctor about reaching your blood sugar goals High blood sugars (greater than 180 mg/dL) can raise your risk of infections and slow your recovery, so you will need to focus on controlling your diabetes during the weeks before surgery. Make sure that the doctor who takes care of your diabetes knows about your planned surgery including the date and location.  How do I manage my blood sugar before surgery? Check your blood sugar at least 4 times a day, starting 2 days before surgery, to make sure that the level is not too high or low.  Check your blood sugar the morning of your surgery when you wake up and every  2 hours until you get to the Short Stay unit.  If your blood sugar is less than 70 mg/dL, you will need to treat for low blood sugar: Do not take insulin. Treat a low blood sugar (less than 70 mg/dL) with  cup of clear juice (cranberry or apple), 4 glucose tablets, OR glucose gel. Recheck blood sugar in 15 minutes after treatment (to make sure it is greater than 70 mg/dL). If your blood sugar is not greater than 70 mg/dL on recheck, call [phone] for further instructions. Report your blood sugar to the short stay nurse  when you get to Short Stay.  If you are admitted to the hospital after surgery: Your blood sugar will be checked by the staff and you will probably be given insulin after surgery (instead of oral diabetes medicines) to make sure you have good blood sugar levels. The goal for blood sugar control after surgery is 80-180 mg/dL.                      Do NOT Smoke (Tobacco/Vaping) for 24 hours prior to your procedure.  If you use a CPAP at night, you may bring your mask/headgear for your overnight stay.   You will be asked to remove any contacts, glasses, piercing's, hearing aid's, dentures/partials prior to surgery. Please bring cases for these items if needed.    Patients discharged the day of surgery will not be allowed to drive home, and someone needs to stay with them for 24 hours.  SURGICAL WAITING ROOM VISITATION Patients may have no more than 2 support people in the waiting area - these visitors may rotate.   Pre-op nurse will coordinate an appropriate time for 1 ADULT support person, who may not rotate, to accompany patient in pre-op.  Children under the age of 4 must have an adult with them who is not the patient and must remain in the main waiting area with an adult.  If the patient needs to stay at the hospital during part of their recovery, the visitor guidelines for inpatient rooms apply.  Please refer to the [facility] website for the visitor guidelines for any additional information.   If you received a COVID test during your pre-op visit  it is requested that you wear a mask when out in public, stay away from anyone that may not be feeling well and notify your surgeon if you develop symptoms. If you have been in contact with anyone that has tested positive in the last 10 days please notify you surgeon.      Pre-operative CHG Bathing Instructions   You can play a key role in reducing the risk of infection after surgery. Your skin needs to be as free of germs as possible.  You can reduce the number of germs on your skin by washing with CHG (chlorhexidine  gluconate) soap before surgery. CHG is an antiseptic soap that kills germs and continues to kill germs even after washing.   DO NOT use if you have an allergy to chlorhexidine /CHG or antibacterial soaps. If your skin becomes reddened or irritated, stop using the CHG and notify one of our RNs at [phone].              TAKE A SHOWER THE NIGHT BEFORE SURGERY AND THE DAY OF SURGERY    Please keep in mind the following:  DO NOT shave, including legs and underarms, 48 hours prior to surgery.   You may shave your face before/day of  surgery.  Place clean sheets on your bed the night before surgery Use a clean washcloth (not used since being washed) for each shower. DO NOT sleep with pet's night before surgery.  CHG Shower Instructions:  Wash your face and private area with normal soap. If you choose to wash your hair, wash first with your normal shampoo.  After you use shampoo/soap, rinse your hair and body thoroughly to remove shampoo/soap residue.  Turn the water OFF and apply half the bottle of CHG soap to a CLEAN washcloth.  Apply CHG soap ONLY FROM YOUR NECK DOWN TO YOUR TOES (washing for 3-5 minutes)  DO NOT use CHG soap on face, private areas, open wounds, or sores.  Pay special attention to the area where your surgery is being performed.  If you are having back surgery, having someone wash your back for you may be helpful. Wait 2 minutes after CHG soap is applied, then you may rinse off the CHG soap.  Pat dry with a clean towel  Put on clean pajamas    Additional instructions for the day of surgery: DO NOT APPLY any lotions, deodorants, cologne, or perfumes.   Do not wear jewelry or makeup Do not wear nail polish, gel polish, artificial nails, or any other type of covering on natural nails (fingers and toes) Do not bring valuables to the hospital. [facility] is not responsible for valuables/personal  belongings. Put on clean/comfortable clothes.  Please brush your teeth.  Ask your nurse before applying any prescription medications to the skin.

## 2024-02-26 ENCOUNTER — Other Ambulatory Visit (HOSPITAL_COMMUNITY): Payer: Self-pay | Admitting: General Surgery

## 2024-02-27 ENCOUNTER — Encounter (HOSPITAL_COMMUNITY)
Admission: RE | Admit: 2024-02-27 | Discharge: 2024-02-27 | Disposition: A | Discharge status: Home / Self Care | Source: Ambulatory Visit | Attending: General Surgery

## 2024-02-27 ENCOUNTER — Encounter (HOSPITAL_COMMUNITY): Payer: Self-pay

## 2024-02-27 ENCOUNTER — Other Ambulatory Visit: Payer: Self-pay

## 2024-02-27 ENCOUNTER — Encounter (HOSPITAL_COMMUNITY): Payer: Self-pay | Admitting: Physician Assistant

## 2024-02-27 VITALS — BP 187/108 | HR 99 | Temp 98.1°F | Resp 17 | Ht 59.0 in | Wt 174.9 lb

## 2024-02-27 DIAGNOSIS — Z01818 Encounter for other preprocedural examination: Secondary | ICD-10-CM

## 2024-02-27 DIAGNOSIS — Z01812 Encounter for preprocedural laboratory examination: Secondary | ICD-10-CM | POA: Diagnosis not present

## 2024-02-27 HISTORY — DX: Personal history of urinary calculi: Z87.442

## 2024-02-27 LAB — BASIC METABOLIC PANEL WITH GFR
Anion gap: 13 (ref 5–15)
BUN: 16 mg/dL (ref 6–20)
CO2: 22 mmol/L (ref 22–32)
Calcium: 9.2 mg/dL (ref 8.9–10.3)
Chloride: 101 mmol/L (ref 98–111)
Creatinine, Ser: 0.64 mg/dL (ref 0.44–1.00)
GFR, Estimated: >60 mL/min (ref >60)
Glucose, Bld: 390 mg/dL — ABNORMAL HIGH (ref 70–99)
Potassium: 4.5 mmol/L (ref 3.5–5.1)
Sodium: 136 mmol/L (ref 135–145)

## 2024-02-27 LAB — CBC
HCT: 42.5 % (ref 36.0–46.0)
Hemoglobin: 14.2 g/dL (ref 12.0–15.0)
MCH: 31.1 pg (ref 26.0–34.0)
MCHC: 33.4 g/dL (ref 30.0–36.0)
MCV: 93 fL (ref 80.0–100.0)
Platelets: 196 K/uL (ref 150–400)
RBC: 4.57 MIL/uL (ref 3.87–5.11)
RDW: 12.4 % (ref 11.5–15.5)
WBC: 6.1 K/uL (ref 4.0–10.5)
nRBC: 0 % (ref 0.0–0.2)

## 2024-02-27 LAB — HEPATIC FUNCTION PANEL
ALT: 126 U/L — ABNORMAL HIGH (ref 0–44)
AST: 75 U/L — ABNORMAL HIGH (ref 15–41)
Albumin: 3.8 g/dL (ref 3.5–5.0)
Alkaline Phosphatase: 60 U/L (ref 38–126)
Bilirubin, Direct: 0.2 mg/dL (ref 0.0–0.2)
Indirect Bilirubin: 0.8 mg/dL (ref 0.3–0.9)
Total Bilirubin: 1 mg/dL (ref 0.0–1.2)
Total Protein: 8.8 g/dL — ABNORMAL HIGH (ref 6.5–8.1)

## 2024-02-27 LAB — GLUCOSE, CAPILLARY: Glucose-Capillary: 443 mg/dL — ABNORMAL HIGH (ref 70–99)

## 2024-02-27 NOTE — H&P (Signed)
 58 year old female who is otherwise fairly healthy. She does drink alcohol daily. She does have diabetes and hypertension. She has no prior abdominal surgery. In June she was seen in the emergency room for epigastric pain. She was taken by ambulance for concern for chest pain. There she was found to have sludge in her gallbladder with mild wall thickening and no evidence of cholecystitis. Her liver function test were normal. She then in July had another episode of right back pain radiating from the front that had an ultrasound that was not technically great but did show some mild gallbladder wall thickening. This was done after her episode of pain so she did not have any sonographic Murphy sign at that point in time. She has had no fevers. She is eating normally again. She has had no abnormal labs with regard to her liver function tests or lipase. She presents today to discuss her options.  Review of Systems: A complete review of systems was obtained from the patient. I have reviewed this information and discussed as appropriate with the patient. See HPI as well for other ROS.  Review of Systems  Skin: Positive for rash.  All other systems reviewed and are negative.   Medical History: Past Medical History:  Diagnosis Date  Anxiety  Arthritis  Diabetes mellitus without complication (CMS/HHS-HCC)  GERD (gastroesophageal reflux disease)  Hyperlipidemia  Hypertension  Sleep apnea   Past Surgical History:  Procedure Laterality Date  CESAREAN SECTION 2009  Deviated Septum   No Known Allergies  Current Outpatient Medications on File Prior to Visit  Medication Sig Dispense Refill  aluminum & magnesium hydroxide-simethicone , DUH/DRH, (MYLANTA MAXIMUM) 400-400-40 mg/5 mL suspension Take 10 mLs by mouth every 6 (six) hours as needed for indigestion.  amLODIPine (NORVASC) 10 MG tablet Take 10 mg by mouth once daily  aspirin 81 MG chewable tablet  atorvastatin (LIPITOR) 40 MG tablet Take 40 mg  by mouth once daily  bisoprolol (ZEBETA) 10 MG tablet Take 10 mg by mouth once daily  ezetimibe (ZETIA) 10 mg tablet take ONE tablet Orally ONCE a DAy.  fluticasone  propionate (FLONASE ) 50 mcg/actuation nasal spray Place 2 sprays into both nostrils once daily  JARDIANCE 25 mg tablet Take 25 mg by mouth once daily  leflunomide (ARAVA) 20 MG tablet Take 20 mg by mouth once daily  losartan (COZAAR) 100 MG tablet Take 100 mg by mouth once daily  MOUNJARO  10 mg/0.5 mL pen injector inject 10mg  Subcutaneous once a week.  pantoprazole  (PROTONIX ) 20 MG DR tablet once daily   Family History  Problem Relation Age of Onset  Obesity Mother  Skin cancer Father  High blood pressure (Hypertension) Father  Deep vein thrombosis (DVT or abnormal blood clot formation) Father    Social History   Tobacco Use  Smoking Status Never  Smokeless Tobacco Never  Marital status: Married  Tobacco Use  Smoking status: Never  Smokeless tobacco: Never  Vaping Use  Vaping status: Never Used  Substance and Sexual Activity  Alcohol use: Yes  Alcohol/week: 12.0 - 28.0 standard drinks of alcohol  Types: 12 - 28 Standard drinks or equivalent per week  Drug use: Never   Objective:   Body mass index is 35.35 kg/m.  Physical Exam Vitals reviewed.  Constitutional:  Appearance: Normal appearance.  Eyes:  General: No scleral icterus. Abdominal:  Comments: Soft mild tender epigastrium Neurological:  Mental Status: She is alert.    Assessment and Plan:   Calculus of gallbladder without cholecystitis without obstruction  Laparoscopic cholecystectomy  I do think her symptoms are related to her gallbladder. We discussed with symptoms recommend surgery to prevent current symptoms as well as more complicated disease.  I discussed the procedure in detail. We discussed the risks and benefits of a laparoscopic cholecystectomy and possible cholangiogram including, but not limited to bleeding, infection, injury to  surrounding structures such as the intestine or liver, bile leak, retained gallstones, need to convert to an open procedure, prolonged diarrhea, blood clots such as DVT, common bile duct injury, anesthesia risks, and possible need for additional procedures. We also discussed small risk of subtotal cholecystectomy. The likelihood of improvement in symptoms and return to the patient's normal status is good. We discussed the typical post-operative recovery course.

## 2024-02-27 NOTE — Progress Notes (Addendum)
 PCP - Dr. Clarice Cardiologist - denies Endocrinologist - Dr. Littie Caffey  PPM/ICD - denies   Chest x-ray - 11/25/23 EKG - 11/25/23 Stress Test - denies ECHO - 08/07/14 Cardiac Cath - denies  Sleep Study - denies   Fasting Blood Sugar - unknown Checks Blood Sugar ___0__ times a day  Last dose of GLP1 agonist-  last dose of Mounjaro  was at the beginning of the month - she states she thinks it was 9/2 GLP1 instructions: patient aware to hold for 7 days  Blood Thinner Instructions: n/a Aspirin Instructions: patient instructed to contact surgeon's office  ERAS Protcol - clears until 0915 PRE-SURGERY Ensure or G2-  G2 as ordered  COVID TEST- n/a   Anesthesia review: yes - CBG and BP  Patient denies shortness of breath, fever, cough and chest pain at PAT appointment   All instructions explained to the patient, with a verbal understanding of the material. Patient agrees to go over the instructions while at home for a better understanding. Patient also instructed to self quarantine after being tested for COVID-19. The opportunity to ask questions was provided.   Patient's blood sugar was 443 and BP was 180/108 on arrival to PAT.  Patient states that at 1230 she had a diet coke, grilled chicken sandwich and french fries.  She states that she has not taken any medication in a days due to family stress with her parents.  Patient states that caring for her parents has been a priority over her own health.   Lynwood Hope was notified at [facility] appointment.  Patient stated that she did not think she was in best shape to have surgery and agreed that surgery should be rescheduled.  Notified Dr, Gelene office that surgery would need to be rescheduled.   Patient was educated on the importance of taking her medications as prescribed and checking blood sugar.

## 2024-02-28 ENCOUNTER — Encounter (HOSPITAL_COMMUNITY): Admission: RE | Payer: Self-pay | Source: Home / Self Care

## 2024-02-28 ENCOUNTER — Ambulatory Visit (HOSPITAL_COMMUNITY): Admission: RE | Admit: 2024-02-28 | Source: Home / Self Care | Admitting: General Surgery

## 2024-02-28 SURGERY — LAPAROSCOPIC CHOLECYSTECTOMY
Anesthesia: General

## 2024-03-07 ENCOUNTER — Encounter (INDEPENDENT_AMBULATORY_CARE_PROVIDER_SITE_OTHER): Payer: Self-pay | Admitting: Otolaryngology

## 2024-03-13 DIAGNOSIS — I10 Essential (primary) hypertension: Secondary | ICD-10-CM | POA: Diagnosis not present

## 2024-03-13 DIAGNOSIS — E118 Type 2 diabetes mellitus with unspecified complications: Secondary | ICD-10-CM | POA: Diagnosis not present

## 2024-03-15 ENCOUNTER — Other Ambulatory Visit

## 2024-03-19 ENCOUNTER — Other Ambulatory Visit (INDEPENDENT_AMBULATORY_CARE_PROVIDER_SITE_OTHER): Payer: Self-pay | Admitting: Otolaryngology

## 2024-03-19 MED ORDER — AMOXICILLIN-POT CLAVULANATE 875-125 MG PO TABS
1.0000 | ORAL_TABLET | Freq: Two times a day (BID) | ORAL | 0 refills | Status: AC
Start: 2024-03-19 — End: 2024-04-09

## 2024-03-19 NOTE — Progress Notes (Unsigned)
 Patient ID: [name], female   DOB: 10-May-1966, 58 y.o.   MRN: [id number]  The patient's insurance denied her CT scan.  It requires 3 weeks of antibiotic treatment prior to CT scan approval.  A prescription for 3 weeks of Augmentin was sent to the patient's pharmacy.

## 2024-03-27 ENCOUNTER — Other Ambulatory Visit: Payer: Self-pay

## 2024-03-27 ENCOUNTER — Encounter (HOSPITAL_BASED_OUTPATIENT_CLINIC_OR_DEPARTMENT_OTHER): Payer: Self-pay | Admitting: General Surgery

## 2024-03-27 ENCOUNTER — Ambulatory Visit
Admission: RE | Admit: 2024-03-27 | Discharge: 2024-03-27 | Disposition: A | Source: Ambulatory Visit | Attending: Otolaryngology | Admitting: Otolaryngology

## 2024-03-27 DIAGNOSIS — J320 Chronic maxillary sinusitis: Secondary | ICD-10-CM | POA: Diagnosis not present

## 2024-03-27 DIAGNOSIS — J342 Deviated nasal septum: Secondary | ICD-10-CM | POA: Diagnosis not present

## 2024-03-27 NOTE — Progress Notes (Signed)
   03/27/24 0946  Pre-op Phone Call  Surgery Date Verified 04/03/24  Arrival Time Verified 1150  Surgery Location Verified [facility]  Medical History Reviewed Yes  Is the patient taking a GLP-1 receptor agonist? Yes  Has the patient been informed on holding medication? (S)  Yes (not currenlt on)  Does the patient have diabetes? Type II  Does the patient use a Continuous Blood Glucose Monitor? No  Is the patient on an insulin pump? No  Has the diabetes coordinator been notified? No  Do you have a history of heart problems? No  Does patient have other implanted devices? No  Patient Teaching Enhanced Recovery;Pre / Post Procedure  Patient educated about smoking cessation 24 hours prior to surgery. N/A Non-Smoker  Patient verbalizes understanding of bowel prep? N/A  THA/TKA patients only:  By your surgery date, will you have been taking narcotics for 90 days or greater? No  Med Rec Completed Yes  Take the Following Meds the Morning of Surgery amlodipine  Recent  Lab Work, EKG, CXR? Yes  NPO (Including gum & candy) After midnight  Allowed clear liquids Water;Gatorade  (diabetics please choose diet or no sugar options)  Patient instructed to stop clear liquids including Carb loading drink at: 1050  Stop Solids, Milk, Candy, and Gum STARTING AT MIDNIGHT  Did patient view EMMI videos? No  Responsible adult to drive and be with you for 24 hours? Yes  Name & Phone Number for Ride/Caregiver not sure yet but working on it  No Jewelry, money, nail polish or make-up.  No lotions, powders, perfumes. No shaving  48 hrs. prior to surgery. Yes  Contacts, Dentures & Glasses Will Have to be Removed Before OR. Yes  Please bring your ID and Insurance Card the morning of your surgery. (Surgery Centers Only) Yes  Bring any papers or x-rays with you that your surgeon gave you. Yes  Instructed to contact the location of procedure/ provider if they or anyone in their household develops symptoms or tests positive  for COVID-19, has close contact with someone who tests positive for COVID, or has known exposure to any contagious illness. Yes  Call this number the morning of surgery  with any problems that may cancel your surgery. [phone]  Covid-19 Assessment  Have you had a positive COVID-19 test within the previous 90 days? No  COVID Testing Guidance Proceed with the additional questions.  Patient's surgery required a COVID-19 test (cardiothoracic, complex ENT, and bronchoscopies/ EBUS) No  Have you been unmasked and in close contact with anyone with COVID-19 or COVID-19 symptoms within the past 10 days? No  Do you or anyone in your household currently have any COVID-19 symptoms? No

## 2024-03-28 ENCOUNTER — Encounter (HOSPITAL_BASED_OUTPATIENT_CLINIC_OR_DEPARTMENT_OTHER)
Admission: RE | Admit: 2024-03-28 | Discharge: 2024-03-28 | Disposition: A | Discharge status: Home / Self Care | Source: Ambulatory Visit | Attending: General Surgery | Admitting: General Surgery

## 2024-03-28 DIAGNOSIS — Z01812 Encounter for preprocedural laboratory examination: Secondary | ICD-10-CM | POA: Insufficient documentation

## 2024-03-28 DIAGNOSIS — Z01818 Encounter for other preprocedural examination: Secondary | ICD-10-CM | POA: Diagnosis not present

## 2024-03-28 LAB — BASIC METABOLIC PANEL WITH GFR
Anion gap: 13 (ref 5–15)
BUN: 13 mg/dL (ref 6–20)
CO2: 23 mmol/L (ref 22–32)
Calcium: 8.8 mg/dL — ABNORMAL LOW (ref 8.9–10.3)
Chloride: 102 mmol/L (ref 98–111)
Creatinine, Ser: 0.78 mg/dL (ref 0.44–1.00)
GFR, Estimated: >60 mL/min (ref >60)
Glucose, Bld: 309 mg/dL — ABNORMAL HIGH (ref 70–99)
Potassium: 4 mmol/L (ref 3.5–5.1)
Sodium: 138 mmol/L (ref 135–145)

## 2024-04-02 ENCOUNTER — Encounter (INDEPENDENT_AMBULATORY_CARE_PROVIDER_SITE_OTHER): Payer: Self-pay | Admitting: Otolaryngology

## 2024-04-02 ENCOUNTER — Other Ambulatory Visit: Payer: Self-pay | Admitting: General Surgery

## 2024-04-02 ENCOUNTER — Ambulatory Visit (INDEPENDENT_AMBULATORY_CARE_PROVIDER_SITE_OTHER): Admitting: Otolaryngology

## 2024-04-02 VITALS — BP 132/84 | Temp 98.0°F

## 2024-04-02 DIAGNOSIS — J320 Chronic maxillary sinusitis: Secondary | ICD-10-CM

## 2024-04-02 DIAGNOSIS — J342 Deviated nasal septum: Secondary | ICD-10-CM | POA: Diagnosis not present

## 2024-04-02 DIAGNOSIS — J343 Hypertrophy of nasal turbinates: Secondary | ICD-10-CM

## 2024-04-02 DIAGNOSIS — J3489 Other specified disorders of nose and nasal sinuses: Secondary | ICD-10-CM

## 2024-04-02 DIAGNOSIS — J338 Other polyp of sinus: Secondary | ICD-10-CM | POA: Diagnosis not present

## 2024-04-02 NOTE — H&P (Signed)
 58 year old female who is otherwise fairly healthy. She does drink alcohol daily. She does have diabetes and hypertension. She has no prior abdominal surgery. In June she was seen in the emergency room for epigastric pain. She was taken by ambulance for concern for chest pain. There she was found to have sludge in her gallbladder with mild wall thickening and no evidence of cholecystitis. Her liver function test were normal. She then in July had another episode of right back pain radiating from the front that had an ultrasound that was not technically great but did show some mild gallbladder wall thickening. This was done after her episode of pain so she did not have any sonographic Murphy sign at that point in time. She has had no fevers. She is eating normally again. She has had no abnormal labs with regard to her liver function tests or lipase. She presents today to discuss her options.  Review of Systems: A complete review of systems was obtained from the patient. I have reviewed this information and discussed as appropriate with the patient. See HPI as well for other ROS.  Review of Systems  Skin: Positive for rash.  All other systems reviewed and are negative.   Medical History: Past Medical History:  Diagnosis Date  Anxiety  Arthritis  Diabetes mellitus without complication (CMS/HHS-HCC)  GERD (gastroesophageal reflux disease)  Hyperlipidemia  Hypertension  Sleep apnea   There is no problem list on file for this patient.  Past Surgical History:  Procedure Laterality Date  CESAREAN SECTION 2009  Deviated Septum   No Known Allergies  Current Outpatient Medications on File Prior to Visit  Medication Sig Dispense Refill  aluminum & magnesium hydroxide-simethicone , DUH/DRH, (MYLANTA MAXIMUM) 400-400-40 mg/5 mL suspension Take 10 mLs by mouth every 6 (six) hours as needed for indigestion.  amLODIPine (NORVASC) 10 MG tablet Take 10 mg by mouth once daily  aspirin 81 MG chewable  tablet  atorvastatin (LIPITOR) 40 MG tablet Take 40 mg by mouth once daily  bisoprolol (ZEBETA) 10 MG tablet Take 10 mg by mouth once daily  ezetimibe (ZETIA) 10 mg tablet take ONE tablet Orally ONCE a DAy.  fluticasone  propionate (FLONASE ) 50 mcg/actuation nasal spray Place 2 sprays into both nostrils once daily  JARDIANCE 25 mg tablet Take 25 mg by mouth once daily  leflunomide (ARAVA) 20 MG tablet Take 20 mg by mouth once daily  losartan (COZAAR) 100 MG tablet Take 100 mg by mouth once daily  MOUNJARO  10 mg/0.5 mL pen injector inject 10mg  Subcutaneous once a week.  pantoprazole  (PROTONIX ) 20 MG DR tablet once daily   Family History  Problem Relation Age of Onset  Obesity Mother  Skin cancer Father  High blood pressure (Hypertension) Father  Deep vein thrombosis (DVT or abnormal blood clot formation) Father    Social History   Tobacco Use  Smoking Status Never  Smokeless Tobacco Never  Marital status: Married  Tobacco Use  Smoking status: Never  Smokeless tobacco: Never  Vaping Use  Vaping status: Never Used  Substance and Sexual Activity  Alcohol use: Yes  Alcohol/week: 12.0 - 28.0 standard drinks of alcohol  Types: 12 - 28 Standard drinks or equivalent per week  Drug use: Never    Objective:   Vitals:  01/09/24 1002  BP: (!) 178/101  Pulse: 89  Temp: 36.7 C (98.1 F)  SpO2: 97%  Weight: 79.4 kg (175 lb)  Height: 149.9 cm (4' 11)  PainSc: 0-No pain  PainLoc: Abdomen   Body  mass index is 35.35 kg/m.  Physical Exam Vitals reviewed.  Constitutional:  Appearance: Normal appearance.  Eyes:  General: No scleral icterus. Abdominal:  Comments: Soft mild tender epigastrium Neurological:  Mental Status: She is alert.     Assessment and Plan:   Calculus of gallbladder without cholecystitis without obstruction  Laparoscopic cholecystectomy  I do think her symptoms are related to her gallbladder. We discussed with symptoms recommend surgery to prevent  current symptoms as well as more complicated disease.  I discussed the procedure in detail. We discussed the risks and benefits of a laparoscopic cholecystectomy and possible cholangiogram including, but not limited to bleeding, infection, injury to surrounding structures such as the intestine or liver, bile leak, retained gallstones, need to convert to an open procedure, prolonged diarrhea, blood clots such as DVT, common bile duct injury, anesthesia risks, and possible need for additional procedures. We also discussed small risk of subtotal cholecystectomy. The likelihood of improvement in symptoms and return to the patient's normal status is good. We discussed the typical post-operative recovery course.

## 2024-04-03 ENCOUNTER — Ambulatory Visit (HOSPITAL_BASED_OUTPATIENT_CLINIC_OR_DEPARTMENT_OTHER): Admitting: Anesthesiology

## 2024-04-03 ENCOUNTER — Ambulatory Visit (HOSPITAL_BASED_OUTPATIENT_CLINIC_OR_DEPARTMENT_OTHER)
Admission: RE | Admit: 2024-04-03 | Discharge: 2024-04-03 | Disposition: A | Discharge status: Home / Self Care | Attending: General Surgery | Admitting: General Surgery

## 2024-04-03 ENCOUNTER — Encounter (HOSPITAL_BASED_OUTPATIENT_CLINIC_OR_DEPARTMENT_OTHER): Payer: Self-pay | Admitting: General Surgery

## 2024-04-03 ENCOUNTER — Other Ambulatory Visit: Payer: Self-pay

## 2024-04-03 ENCOUNTER — Encounter (HOSPITAL_BASED_OUTPATIENT_CLINIC_OR_DEPARTMENT_OTHER)
Admission: RE | Disposition: A | Discharge status: Home / Self Care | Payer: Self-pay | Source: Home / Self Care | Attending: General Surgery

## 2024-04-03 DIAGNOSIS — F32A Depression, unspecified: Secondary | ICD-10-CM | POA: Insufficient documentation

## 2024-04-03 DIAGNOSIS — J320 Chronic maxillary sinusitis: Secondary | ICD-10-CM | POA: Insufficient documentation

## 2024-04-03 DIAGNOSIS — E119 Type 2 diabetes mellitus without complications: Secondary | ICD-10-CM | POA: Insufficient documentation

## 2024-04-03 DIAGNOSIS — Z79899 Other long term (current) drug therapy: Secondary | ICD-10-CM | POA: Insufficient documentation

## 2024-04-03 DIAGNOSIS — I10 Essential (primary) hypertension: Secondary | ICD-10-CM | POA: Insufficient documentation

## 2024-04-03 DIAGNOSIS — F419 Anxiety disorder, unspecified: Secondary | ICD-10-CM | POA: Insufficient documentation

## 2024-04-03 DIAGNOSIS — E669 Obesity, unspecified: Secondary | ICD-10-CM | POA: Insufficient documentation

## 2024-04-03 DIAGNOSIS — E785 Hyperlipidemia, unspecified: Secondary | ICD-10-CM | POA: Diagnosis not present

## 2024-04-03 DIAGNOSIS — Z7969 Long term (current) use of other immunomodulators and immunosuppressants: Secondary | ICD-10-CM | POA: Diagnosis not present

## 2024-04-03 DIAGNOSIS — J3489 Other specified disorders of nose and nasal sinuses: Secondary | ICD-10-CM | POA: Insufficient documentation

## 2024-04-03 DIAGNOSIS — Z794 Long term (current) use of insulin: Secondary | ICD-10-CM | POA: Insufficient documentation

## 2024-04-03 DIAGNOSIS — K811 Chronic cholecystitis: Secondary | ICD-10-CM | POA: Insufficient documentation

## 2024-04-03 DIAGNOSIS — Z6835 Body mass index (BMI) 35.0-35.9, adult: Secondary | ICD-10-CM | POA: Diagnosis not present

## 2024-04-03 DIAGNOSIS — Z7985 Long-term (current) use of injectable non-insulin antidiabetic drugs: Secondary | ICD-10-CM | POA: Diagnosis not present

## 2024-04-03 DIAGNOSIS — Z01818 Encounter for other preprocedural examination: Secondary | ICD-10-CM

## 2024-04-03 LAB — GLUCOSE, CAPILLARY
Glucose-Capillary: 270 mg/dL — ABNORMAL HIGH (ref 70–99)
Glucose-Capillary: 273 mg/dL — ABNORMAL HIGH (ref 70–99)

## 2024-04-03 SURGERY — LAPAROSCOPIC CHOLECYSTECTOMY
Anesthesia: General | Site: Abdomen

## 2024-04-03 MED ORDER — MIDAZOLAM HCL 5 MG/5ML IJ SOLN
INTRAMUSCULAR | Status: DC | PRN
  Administered 2024-04-03: 2 mg via INTRAVENOUS

## 2024-04-03 MED ORDER — FENTANYL CITRATE (PF) 100 MCG/2ML IJ SOLN
INTRAMUSCULAR | Status: AC
  Filled 2024-04-03: qty 2

## 2024-04-03 MED ORDER — MIDAZOLAM HCL 2 MG/2ML IJ SOLN
INTRAMUSCULAR | Status: AC
  Filled 2024-04-03: qty 2

## 2024-04-03 MED ORDER — FENTANYL CITRATE (PF) 100 MCG/2ML IJ SOLN
25.0000 ug | INTRAMUSCULAR | Status: DC | PRN
  Administered 2024-04-03: 50 ug via INTRAVENOUS

## 2024-04-03 MED ORDER — SCOPOLAMINE 1 MG/3DAYS TD PT72
1.0000 | MEDICATED_PATCH | TRANSDERMAL | Status: DC
  Administered 2024-04-03: 1 mg via TRANSDERMAL

## 2024-04-03 MED ORDER — PROPOFOL 10 MG/ML IV BOLUS
INTRAVENOUS | Status: DC | PRN
  Administered 2024-04-03: 50 mg via INTRAVENOUS
  Administered 2024-04-03: 100 mg via INTRAVENOUS
  Administered 2024-04-03: 50 mg via INTRAVENOUS

## 2024-04-03 MED ORDER — ALBUTEROL SULFATE HFA 108 (90 BASE) MCG/ACT IN AERS
INHALATION_SPRAY | RESPIRATORY_TRACT | Status: DC | PRN
  Administered 2024-04-03: 2 via RESPIRATORY_TRACT

## 2024-04-03 MED ORDER — CEFAZOLIN SODIUM-DEXTROSE 2-4 GM/100ML-% IV SOLN
INTRAVENOUS | Status: AC
  Filled 2024-04-03: qty 100

## 2024-04-03 MED ORDER — CHLORHEXIDINE GLUCONATE CLOTH 2 % EX PADS
6.0000 | MEDICATED_PAD | Freq: Once | CUTANEOUS | Status: DC
Start: 2024-04-03 — End: 2024-04-03

## 2024-04-03 MED ORDER — OXYCODONE HCL 5 MG PO TABS: 5.0000 mg | ORAL_TABLET | Freq: Four times a day (QID) | ORAL | 0 refills | Status: AC | PRN

## 2024-04-03 MED ORDER — SPY AGENT GREEN - (INDOCYANINE FOR INJECTION)
1.2500 mg | Freq: Once | INTRAMUSCULAR | Status: AC
  Administered 2024-04-03: 1.25 mg via INTRAVENOUS
  Filled 2024-04-03: qty 10

## 2024-04-03 MED ORDER — ROCURONIUM BROMIDE 10 MG/ML (PF) SYRINGE
PREFILLED_SYRINGE | INTRAVENOUS | Status: AC
Start: 2024-04-03 — End: 2024-04-03
  Filled 2024-04-03: qty 10

## 2024-04-03 MED ORDER — KETOROLAC TROMETHAMINE 30 MG/ML IJ SOLN
INTRAMUSCULAR | Status: AC
  Filled 2024-04-03: qty 1

## 2024-04-03 MED ORDER — ENSURE PRE-SURGERY PO LIQD: 296.0000 mL | Freq: Once | ORAL | Status: DC

## 2024-04-03 MED ORDER — SCOPOLAMINE 1 MG/3DAYS TD PT72
MEDICATED_PATCH | TRANSDERMAL | Status: AC
Start: 2024-04-03 — End: 2024-04-03
  Filled 2024-04-03: qty 1

## 2024-04-03 MED ORDER — KETOROLAC TROMETHAMINE 30 MG/ML IJ SOLN: 30.0000 mg | Freq: Once | INTRAMUSCULAR | Status: DC | PRN

## 2024-04-03 MED ORDER — DEXAMETHASONE SOD PHOSPHATE PF 10 MG/ML IJ SOLN
INTRAMUSCULAR | Status: DC | PRN
  Administered 2024-04-03: 5 mg via INTRAVENOUS

## 2024-04-03 MED ORDER — OXYCODONE HCL 5 MG PO TABS: 5.0000 mg | ORAL_TABLET | Freq: Once | ORAL | Status: DC | PRN

## 2024-04-03 MED ORDER — SUGAMMADEX SODIUM 200 MG/2ML IV SOLN
INTRAVENOUS | Status: DC | PRN
  Administered 2024-04-03: 200 mg via INTRAVENOUS

## 2024-04-03 MED ORDER — GLYCOPYRROLATE PF 0.2 MG/ML IJ SOSY
PREFILLED_SYRINGE | INTRAMUSCULAR | Status: AC
  Filled 2024-04-03: qty 1

## 2024-04-03 MED ORDER — ARTIFICIAL TEARS OPHTHALMIC OINT
TOPICAL_OINTMENT | OPHTHALMIC | Status: AC
  Filled 2024-04-03: qty 3.5

## 2024-04-03 MED ORDER — ACETAMINOPHEN 500 MG PO TABS
1000.0000 mg | ORAL_TABLET | ORAL | Status: AC
  Administered 2024-04-03: 1000 mg via ORAL

## 2024-04-03 MED ORDER — SODIUM CHLORIDE 0.9 % IR SOLN
Status: DC | PRN
  Administered 2024-04-03: 250 mL

## 2024-04-03 MED ORDER — INSULIN ASPART 100 UNIT/ML IJ SOLN
4.0000 [IU] | Freq: Once | INTRAMUSCULAR | Status: AC
  Administered 2024-04-03: 4 [IU] via SUBCUTANEOUS

## 2024-04-03 MED ORDER — LACTATED RINGERS IV SOLN: INTRAVENOUS | Status: DC

## 2024-04-03 MED ORDER — ALBUTEROL SULFATE HFA 108 (90 BASE) MCG/ACT IN AERS
INHALATION_SPRAY | RESPIRATORY_TRACT | Status: AC
  Filled 2024-04-03: qty 6.7

## 2024-04-03 MED ORDER — GLYCOPYRROLATE PF 0.2 MG/ML IJ SOSY
PREFILLED_SYRINGE | INTRAMUSCULAR | Status: DC | PRN
Start: 2024-04-03 — End: 2024-04-03
  Administered 2024-04-03: .2 mg via INTRAVENOUS

## 2024-04-03 MED ORDER — ONDANSETRON HCL 4 MG/2ML IJ SOLN
INTRAMUSCULAR | Status: DC | PRN
  Administered 2024-04-03: 4 mg via INTRAVENOUS

## 2024-04-03 MED ORDER — OXYCODONE HCL 5 MG/5ML PO SOLN: 5.0000 mg | Freq: Once | ORAL | Status: DC | PRN

## 2024-04-03 MED ORDER — AMISULPRIDE (ANTIEMETIC) 5 MG/2ML IV SOLN: 10.0000 mg | Freq: Once | INTRAVENOUS | Status: DC | PRN

## 2024-04-03 MED ORDER — ONDANSETRON HCL 4 MG/2ML IJ SOLN
INTRAMUSCULAR | Status: AC
Start: 2024-04-03 — End: 2024-04-03
  Filled 2024-04-03: qty 2

## 2024-04-03 MED ORDER — ACETAMINOPHEN 500 MG PO TABS
ORAL_TABLET | ORAL | Status: AC
Start: 2024-04-03 — End: 2024-04-03
  Filled 2024-04-03: qty 2

## 2024-04-03 MED ORDER — FENTANYL CITRATE (PF) 100 MCG/2ML IJ SOLN
INTRAMUSCULAR | Status: DC | PRN
  Administered 2024-04-03: 50 ug via INTRAVENOUS
  Administered 2024-04-03: 100 ug via INTRAVENOUS
  Administered 2024-04-03: 50 ug via INTRAVENOUS

## 2024-04-03 MED ORDER — LIDOCAINE 2% (20 MG/ML) 5 ML SYRINGE
INTRAMUSCULAR | Status: DC | PRN
  Administered 2024-04-03: 60 mg via INTRAVENOUS

## 2024-04-03 MED ORDER — ROCURONIUM BROMIDE 10 MG/ML (PF) SYRINGE
PREFILLED_SYRINGE | INTRAVENOUS | Status: DC | PRN
  Administered 2024-04-03: 10 mg via INTRAVENOUS
  Administered 2024-04-03: 50 mg via INTRAVENOUS

## 2024-04-03 MED ORDER — BUPIVACAINE HCL (PF) 0.25 % IJ SOLN
INTRAMUSCULAR | Status: DC | PRN
  Administered 2024-04-03: 11 mL

## 2024-04-03 MED ORDER — INSULIN ASPART 100 UNIT/ML IJ SOLN
5.0000 [IU] | Freq: Once | INTRAMUSCULAR | Status: AC
  Administered 2024-04-03: 5 [IU] via SUBCUTANEOUS

## 2024-04-03 MED ORDER — LIDOCAINE 2% (20 MG/ML) 5 ML SYRINGE
INTRAMUSCULAR | Status: AC
  Filled 2024-04-03: qty 5

## 2024-04-03 MED ORDER — CEFAZOLIN SODIUM-DEXTROSE 2-4 GM/100ML-% IV SOLN
2.0000 g | INTRAVENOUS | Status: AC
  Administered 2024-04-03: 2 g via INTRAVENOUS

## 2024-04-03 MED ORDER — KETOROLAC TROMETHAMINE 30 MG/ML IJ SOLN
INTRAMUSCULAR | Status: DC | PRN
  Administered 2024-04-03: 30 mg via INTRAVENOUS

## 2024-04-03 MED ORDER — EPHEDRINE SULFATE (PRESSORS) 25 MG/5ML IV SOSY
PREFILLED_SYRINGE | INTRAVENOUS | Status: DC | PRN
  Administered 2024-04-03 (×2): 5 mg via INTRAVENOUS

## 2024-04-03 SURGICAL SUPPLY — 38 items
BLADE CLIPPER SURG (BLADE) IMPLANT
CANISTER SUCT 1200ML W/VALVE (MISCELLANEOUS) ×2 IMPLANT
CHLORAPREP W/TINT 26 (MISCELLANEOUS) ×2 IMPLANT
CLIP APPLIE 5 13 M/L LIGAMAX5 (MISCELLANEOUS) ×2 IMPLANT
COVER MAYO STAND STRL (DRAPES) IMPLANT
DERMABOND ADVANCED .7 DNX12 (GAUZE/BANDAGES/DRESSINGS) ×2 IMPLANT
DEVICE TROCAR PUNCTURE CLOSURE (ENDOMECHANICALS) IMPLANT
DRAPE C-ARM 42X72 X-RAY (DRAPES) IMPLANT
DRAPE LAPAROSCOPIC ABDOMINAL (DRAPES) ×2 IMPLANT
ELECTRODE REM PT RTRN 9FT ADLT (ELECTROSURGICAL) ×2 IMPLANT
GAUZE 4X4 16PLY ~~LOC~~+RFID DBL (SPONGE) ×2 IMPLANT
GLOVE BIO SURGEON STRL SZ7 (GLOVE) ×2 IMPLANT
GLOVE BIOGEL PI IND STRL 6.5 (GLOVE) IMPLANT
GLOVE BIOGEL PI IND STRL 7.0 (GLOVE) IMPLANT
GLOVE BIOGEL PI IND STRL 7.5 (GLOVE) ×2 IMPLANT
GLOVE ECLIPSE 6.5 STRL STRAW (GLOVE) IMPLANT
GOWN STRL REUS W/ TWL LRG LVL3 (GOWN DISPOSABLE) ×6 IMPLANT
GRASPER SUT TROCAR 14GX15 (MISCELLANEOUS) IMPLANT
HEMOSTAT SNOW SURGICEL 2X4 (HEMOSTASIS) IMPLANT
IRRIGATION SUCT STRKRFLW 2 WTP (MISCELLANEOUS) ×2 IMPLANT
KIT IMAGING PINPOINTPAQ (MISCELLANEOUS) ×2 IMPLANT
NS IRRIG 1000ML POUR BTL (IV SOLUTION) ×2 IMPLANT
PACK BASIN DAY SURGERY FS (CUSTOM PROCEDURE TRAY) ×2 IMPLANT
POUCH RETRIEVAL ECOSAC 10 (ENDOMECHANICALS) ×2 IMPLANT
SCISSORS LAP 5X35 DISP (ENDOMECHANICALS) ×2 IMPLANT
SET CHOLANGIOGRAPH 5 50 .035 (SET/KITS/TRAYS/PACK) IMPLANT
SET TUBE SMOKE EVAC HIGH FLOW (TUBING) ×2 IMPLANT
SLEEVE SCD COMPRESS KNEE MED (STOCKING) ×2 IMPLANT
SLEEVE Z-THREAD 5X100MM (TROCAR) ×4 IMPLANT
SPIKE FLUID TRANSFER (MISCELLANEOUS) IMPLANT
STRIP CLOSURE SKIN 1/2X4 (GAUZE/BANDAGES/DRESSINGS) ×2 IMPLANT
SUT MNCRL AB 4-0 PS2 18 (SUTURE) ×2 IMPLANT
SUT VICRYL 0 UR6 27IN ABS (SUTURE) IMPLANT
TOWEL GREEN STERILE FF (TOWEL DISPOSABLE) ×4 IMPLANT
TRAY LAPAROSCOPIC (CUSTOM PROCEDURE TRAY) ×2 IMPLANT
TROCAR BALLN 12MMX100 BLUNT (TROCAR) ×2 IMPLANT
TROCAR Z-THREAD OPTICAL 5X100M (TROCAR) ×2 IMPLANT
TUBE CONNECTING 20X1/4 (TUBING) ×2 IMPLANT

## 2024-04-03 NOTE — Discharge Instructions (Addendum)
 [facility] LAPAROSCOPIC SURGERY: POST OP INSTRUCTIONS  Always review your discharge instruction sheet given to you by the facility where your surgery was performed. IF YOU HAVE DISABILITY OR FAMILY LEAVE FORMS, YOU MUST BRING THEM TO THE OFFICE FOR PROCESSING.   DO NOT GIVE THEM TO YOUR DOCTOR.  A prescription for pain medication may be given to you upon discharge.  Take your pain medication as prescribed, if needed.  If narcotic pain medicine is not needed, then you may take acetaminophen  (Tylenol ), naprosyn (Alleve), or ibuprofen  (Advil ) as needed. Take your usually prescribed medications unless otherwise directed. If you need a refill on your pain medication, please contact your pharmacy.  They will contact our office to request authorization. Prescriptions will not be filled after 5pm or on week-ends. You should follow a light diet the first few days after arrival home, such as soup and crackers, etc.  Be sure to include lots of fluids daily. Most patients will experience some swelling and bruising in the area of the incisions.  Ice packs will help.  Swelling and bruising can take several days to resolve.  It is common to experience some constipation if taking pain medication after surgery.  Increasing fluid intake and taking a stool softener (such as Colace) will usually help or prevent this problem from occurring.  A mild laxative (Milk of Magnesia or Miralax) should be taken according to package instructions if there are no bowel movements after 48 hours. Unless discharge instructions indicate otherwise, you may remove your bandages 48 hours after surgery, and you may shower at that time.  You may have steri-strips (small skin tapes) in place directly over the incision.  These strips should be left on the skin for 7-10 days.  If your surgeon used skin glue on the incision, you may shower in 24 hours.  The glue will flake off over the next  2-3 weeks.  Any sutures or staples will be removed at the office during your follow-up visit. ACTIVITIES:  You may resume regular (light) daily activities beginning the next day--such as daily self-care, walking, climbing stairs--gradually increasing activities as tolerated.  You may have sexual intercourse when it is comfortable.  Refrain from any heavy lifting or straining until approved by your doctor. You may drive when you are no longer taking prescription pain medication, you can comfortably wear a seatbelt, and you can safely maneuver your car and apply brakes. RETURN TO WORK:  __________________________________________________________ [name] should see your doctor in the office for a follow-up appointment approximately 2-3 weeks after your surgery.  Make sure that you call for this appointment within a day or two after you arrive home to insure a convenient appointment time. OTHER INSTRUCTIONS: __________________________________________________________________________________________________________________________ __________________________________________________________________________________________________________________________ WHEN TO CALL YOUR DOCTOR: Fever over 101.0 Inability to urinate Continued bleeding from incision. Increased pain, redness, or drainage from the incision. Increasing abdominal pain  The clinic staff is available to answer your questions during regular business hours.  Please don't hesitate to call and ask to speak to one of the nurses for clinical concerns.  If you have a medical emergency, go to the nearest emergency room or call 911.  A surgeon from [facility] is always on call at the hospital. [address], [address], [place], [place]  [address] ? [address] [address], [place], [place]   [address] [phone] ? [phone] ? FAX [phone] Web site: [email or web address]  No Tylenol  until 6:40 today if needed. No NSAIDS  (Motrin /Ibuprofen ) until  after 7:50pm today if needed.    Post Anesthesia Home Care Instructions  Activity: Get plenty of rest for the remainder of the day. A responsible individual must stay with you for 24 hours following the procedure.  For the next 24 hours, DO NOT: -Drive a car -Advertising copywriter -Drink alcoholic beverages -Take any medication unless instructed by your physician -Make any legal decisions or sign important papers.  Meals: Start with liquid foods such as gelatin or soup. Progress to regular foods as tolerated. Avoid greasy, spicy, heavy foods. If nausea and/or vomiting occur, drink only clear liquids until the nausea and/or vomiting subsides. Call your physician if vomiting continues.  Special Instructions/Symptoms: Your throat may feel dry or sore from the anesthesia or the breathing tube placed in your throat during surgery. If this causes discomfort, gargle with warm salt water. The discomfort should disappear within 24 hours.  If you had a scopolamine patch placed behind your ear for the management of post- operative nausea and/or vomiting:  1. The medication in the patch is effective for 72 hours, after which it should be removed.  Wrap patch in a tissue and discard in the trash. Wash hands thoroughly with soap and water. 2. You may remove the patch earlier than 72 hours if you experience unpleasant side effects which may include dry mouth, dizziness or visual disturbances. 3. Avoid touching the patch. Wash your hands with soap and water after contact with the patch.

## 2024-04-03 NOTE — Anesthesia Procedure Notes (Signed)
 Procedure Name: Intubation Date/Time: 04/03/2024 1:05 PM  Performed by: Claudene Delon SQUIBB, CRNAPre-anesthesia Checklist: Patient identified, Emergency Drugs available, Suction available and Patient being monitored Patient Re-evaluated:Patient Re-evaluated prior to induction Oxygen Delivery Method: Circle System Utilized Preoxygenation: Pre-oxygenation with 100% oxygen Induction Type: IV induction Ventilation: Mask ventilation without difficulty and Oral airway inserted - appropriate to patient size Laryngoscope Size: Mac and 3 Grade View: Grade III Tube type: Oral Tube size: 7.0 mm Number of attempts: 1 Airway Equipment and Method: Stylet Placement Confirmation: ETT inserted through vocal cords under direct vision, positive ETCO2 and breath sounds checked- equal and bilateral Secured at: 21 cm Tube secured with: Tape Dental Injury: Teeth and Oropharynx as per pre-operative assessment

## 2024-04-03 NOTE — Transfer of Care (Signed)
 Immediate Anesthesia Transfer of Care Note  Patient: [name]  Procedure(s) Performed: LAPAROSCOPIC CHOLECYSTECTOMY (Abdomen)  Patient Location: PACU  Anesthesia Type:General  Level of Consciousness: awake, alert , oriented, and patient cooperative  Airway & Oxygen Therapy: Patient Spontanous Breathing and Patient connected to face mask oxygen  Post-op Assessment: Report given to RN and Post -op Vital signs reviewed and stable  Post vital signs: Reviewed and stable  Last Vitals:  Vitals Value Taken Time  BP 117/69 04/03/24 14:15  Temp    Pulse 80 04/03/24 14:18  Resp 11 04/03/24 14:18  SpO2 98 % 04/03/24 14:18  Vitals shown include unfiled device data.  Last Pain:  Vitals:   04/03/24 1239  PainSc: 0-No pain         Complications: No notable events documented.

## 2024-04-03 NOTE — Op Note (Signed)
 Preoperative diagnosis: biliary colic Postoperative diagnosis saa, chronic cholecystitis Procedure: Laparoscopic cholecystectomy Surgeon: Dr. Adina Bury Anesthesia: General  Complications: None Drains: None Estimated blood loss: Minimal Specimens: Gallbladder and contents to pathology Sponge count was correct at completion Disposition recovery stable   Indications: 58 year old female who is otherwise fairly healthy. She does drink alcohol daily. She does have diabetes and hypertension. She has no prior abdominal surgery. In June she was seen in the emergency room for epigastric pain. She was taken by ambulance for concern for chest pain. There she was found to have sludge in her gallbladder with mild wall thickening and no evidence of cholecystitis. Her liver function tests were normal. She then in July had another episode of right back pain radiating from the front that had an ultrasound that was not technically great but did show some mild gallbladder wall thickening. This was done after her episode of pain so she did not have any sonographic Murphy sign at that point in time. We discussed lap chole due to biliary colic.    Procedure: After informed consent was obtained she was taken to the operating room.  She was given antibiotics.  SCDs were placed.  She was placed under anesthesia without complication.  She was prepped and draped in a standard sterile surgical fashion.  A surgical timeout was then performed.   I infiltrated marcaine below the umbilicus and made a vertical incision. I then grasped the fascia and incised it.  I entered the peritoneum bluntly and placed a 0 vicryl pursestring suture.  I then inserted a hasson trocar and insufflated the abdomen to 15 mm Hg pressure. I then inserted three additional 5 mm trocars in the epigastrium and right side of the abdomen. Her liver was quite fatty and her gallbladder was intrahepatic making the case difficult. The gallbladder was then  retracted cephalad and lateral.  I was then able to dissect the triangle and clearly obtain a critical view of safety.  I confirmed my anatomy with the ICG dye as I saw the common duct and the cystic duct. I took the gallbladder off the liver bed for some distance just to confirm this. I then divided the artery after placing clips leaving two in place.  I then clipped the duct with three clips. I divided the duct leaving two in place. The clips completely traversed the duct and the duct was viable.the gallbladder was then removed from the liver bed.  The gallbladder was then placed in a retrieval bag.  I obtained hemostasis. I then removed the gallbladder in the retrieval bag.  The umbilical  trocar was removed.I tied my pursestring down and then placed an additional 0 vicryl suture to completely obliterate the defect.  I then removed the remaining trocars and desufflated the abdomen.  These were closed with 4-0 Monocryl and glue.  She tolerated this well was extubated and transferred recovery stable

## 2024-04-03 NOTE — Progress Notes (Signed)
 Patient ID: [name], female   DOB: 1966-01-02, 58 y.o.   MRN: [id number]  Follow-up: Chronic rhinosinusitis, chronic nasal obstruction  HPI: The patient is a 58 year old female who returns today for her follow-up evaluation.  The patient has a history of frequent recurrent sinusitis and chronic nasal obstruction.  She was treated with multiple courses of antibiotics and allergy medications.  She was last seen in June 2025.  At that time, she was noted to have bilateral nasal polyps, causing significant nasal obstruction.  She subsequently underwent a sinus CT scan.  The CT showed bilateral chronic maxillary sinusitis, bilateral nasal polyps, severe nasal septal deviation, bilateral inferior turbinate hypertrophy, and a large left concha bullosa.  She returns today complaining of persistent nasal obstruction and facial pressure.  She has not responded to medical treatment, despite the use of multiple antibiotics, steroid nasal sprays, allergy medications, and daily nasal saline irrigation.  She has been symptomatic for 40+ years.  She is interested in more definitive treatment.  Exam: General: Communicates without difficulty, well nourished, no acute distress. Head: Normocephalic, no evidence injury, no tenderness, facial buttresses intact without stepoff. Face/sinus: No tenderness to palpation and percussion. Facial movement is normal and symmetric. Eyes: PERRL, EOMI. No scleral icterus, conjunctivae clear. Neuro: CN II exam reveals vision grossly intact.  No nystagmus at any point of gaze. Ears: Auricles well formed without lesions.  Ear canals are intact without mass or lesion.  No erythema or edema is appreciated.  The TMs are intact without fluid. Nose: External evaluation reveals normal support and skin without lesions.  Dorsum is intact.  Anterior rhinoscopy reveals congested mucosa over anterior aspect of inferior turbinates and deviated septum.  Nasal polyps are noted bilaterally, worse on the  right side.  Oral:  Oral cavity and oropharynx are intact, symmetric, without erythema or edema.  Mucosa is moist without lesions. Neck: Full range of motion without pain.  There is no significant lymphadenopathy.  No masses palpable.  Thyroid  bed within normal limits to palpation.  Parotid glands and submandibular glands equal bilaterally without mass.  Trachea is midline. Neuro:  CN 2-12 grossly intact.   Assessment: 1.  Bilateral chronic maxillary sinusitis, with bilateral sinonasal polyps. 2.  Severe nasal septal deviation, bilateral inferior turbinate hypertrophy, and a large left concha bullosa. 3.  The patient has not responded to maximal medical treatment.  Plan: 1.  The physical exam findings and the CT images are extensively reviewed with the patient. 2.  Continue with Flonase  nasal spray 2 sprays each nostril daily.  The patient could not tolerate the use of oral steroids due to her diabetes. 3.  Nasal saline irrigation daily. 4.  Based on the above findings, the patient will benefit from surgical intervention with septoplasty, bilateral turbinate reduction, endoscopic resection of the left concha bullosa, and bilateral maxillary antrostomy with polyp removal.  The risk, benefits, alternatives, and details of the procedures are discussed.  Questions are invited and answered. 5.  The patient will like to proceed with the procedures.

## 2024-04-03 NOTE — Anesthesia Preprocedure Evaluation (Addendum)
 Anesthesia Evaluation  Patient identified by MRN, date of birth, ID band Patient awake    Reviewed: Allergy & Precautions, NPO status , Patient's Chart, lab work & pertinent test results  Airway Mallampati: III  TM Distance: >3 FB Neck ROM: Full    Dental no notable dental hx.    Pulmonary    Pulmonary exam normal        Cardiovascular hypertension, Pt. on medications and Pt. on home beta blockers Normal cardiovascular exam     Neuro/Psych  PSYCHIATRIC DISORDERS Anxiety Depression       GI/Hepatic ,,,(+)     substance abuse  alcohol use  Endo/Other  diabetes, Insulin Dependent  Patient on GLP-1 Agonist  Renal/GU      Musculoskeletal   Abdominal  (+) + obese  Peds  Hematology   Anesthesia Other Findings GALLSTONES  Reproductive/Obstetrics                              Anesthesia Physical Anesthesia Plan  ASA: 3  Anesthesia Plan: General   Post-op Pain Management:    Induction: Intravenous  PONV Risk Score and Plan: 3 and Ondansetron , Dexamethasone, Midazolam, Treatment may vary due to age or medical condition and Scopolamine patch - Pre-op  Airway Management Planned: Oral ETT  Additional Equipment:   Intra-op Plan:   Post-operative Plan: Extubation in OR  Informed Consent: I have reviewed the patients History and Physical, chart, labs and discussed the procedure including the risks, benefits and alternatives for the proposed anesthesia with the patient or authorized representative who has indicated his/her understanding and acceptance.     Dental advisory given  Plan Discussed with: CRNA  Anesthesia Plan Comments:          Anesthesia Quick Evaluation

## 2024-04-03 NOTE — Interval H&P Note (Signed)
 History and Physical Interval Note:  04/03/2024 12:43 PM  [name]  has presented today for surgery, with the diagnosis of GALLSTONES.  The various methods of treatment have been discussed with the patient and family. After consideration of risks, benefits and other options for treatment, the patient has consented to  Procedure(s) with comments: LAPAROSCOPIC CHOLECYSTECTOMY (N/A) - LAPAROSCOPIC CHOLECYSTECTOMY WITH ICG DYE as a surgical intervention.  The patient's history has been reviewed, patient examined, no change in status, stable for surgery.  I have reviewed the patient's chart and labs.  Questions were answered to the patient's satisfaction.     Donnice Bury

## 2024-04-03 NOTE — Anesthesia Postprocedure Evaluation (Signed)
 Anesthesia Post Note  Patient: [name]  Procedure(s) Performed: LAPAROSCOPIC CHOLECYSTECTOMY (Abdomen)     Patient location during evaluation: PACU Anesthesia Type: General Level of consciousness: awake Pain management: pain level controlled Vital Signs Assessment: post-procedure vital signs reviewed and stable Respiratory status: spontaneous breathing, nonlabored ventilation and respiratory function stable Cardiovascular status: blood pressure returned to baseline and stable Postop Assessment: no apparent nausea or vomiting Anesthetic complications: no   No notable events documented.  Last Vitals:  Vitals:   04/03/24 1445 04/03/24 1500  BP: 139/80 (!) 175/92  Pulse: 74 75  Resp: 11 16  Temp:  (!) 36.3 C  SpO2: 96% 94%    Last Pain:  Vitals:   04/03/24 1500  PainSc: 0-No pain                 Nuvia Hileman P Allianna Beaubien

## 2024-04-04 ENCOUNTER — Encounter (HOSPITAL_BASED_OUTPATIENT_CLINIC_OR_DEPARTMENT_OTHER): Payer: Self-pay | Admitting: General Surgery

## 2024-04-04 LAB — SURGICAL PATHOLOGY

## 2024-04-24 ENCOUNTER — Other Ambulatory Visit (HOSPITAL_COMMUNITY): Payer: Self-pay

## 2024-04-27 ENCOUNTER — Ambulatory Visit (INDEPENDENT_AMBULATORY_CARE_PROVIDER_SITE_OTHER): Admitting: Otolaryngology

## 2024-05-03 DIAGNOSIS — J329 Chronic sinusitis, unspecified: Secondary | ICD-10-CM | POA: Diagnosis not present

## 2024-05-03 DIAGNOSIS — J343 Hypertrophy of nasal turbinates: Secondary | ICD-10-CM | POA: Diagnosis not present

## 2024-05-03 DIAGNOSIS — J3489 Other specified disorders of nose and nasal sinuses: Secondary | ICD-10-CM | POA: Diagnosis not present

## 2024-05-03 DIAGNOSIS — J342 Deviated nasal septum: Secondary | ICD-10-CM | POA: Diagnosis not present

## 2024-05-03 DIAGNOSIS — J339 Nasal polyp, unspecified: Secondary | ICD-10-CM | POA: Diagnosis not present

## 2024-05-03 DIAGNOSIS — J338 Other polyp of sinus: Secondary | ICD-10-CM | POA: Diagnosis not present

## 2024-05-03 DIAGNOSIS — J320 Chronic maxillary sinusitis: Secondary | ICD-10-CM | POA: Diagnosis not present

## 2024-05-04 ENCOUNTER — Telehealth (INDEPENDENT_AMBULATORY_CARE_PROVIDER_SITE_OTHER): Payer: Self-pay | Admitting: Otolaryngology

## 2024-05-04 NOTE — Telephone Encounter (Signed)
 Patient called today and stated that she had bright red post  op bleeding from her surgery  (05/03/24). Per Dr.Teoh , I told her it is was normal. Change her bandage every 30 minutes or as needed. She will follow up on 05/07/24.

## 2024-05-07 ENCOUNTER — Ambulatory Visit (INDEPENDENT_AMBULATORY_CARE_PROVIDER_SITE_OTHER): Admitting: Otolaryngology

## 2024-05-07 ENCOUNTER — Encounter (INDEPENDENT_AMBULATORY_CARE_PROVIDER_SITE_OTHER): Payer: Self-pay | Admitting: Otolaryngology

## 2024-05-07 ENCOUNTER — Telehealth (INDEPENDENT_AMBULATORY_CARE_PROVIDER_SITE_OTHER): Payer: Self-pay | Admitting: Otolaryngology

## 2024-05-07 VITALS — BP 167/92 | HR 87 | Ht 60.0 in | Wt 170.0 lb

## 2024-05-07 DIAGNOSIS — J338 Other polyp of sinus: Secondary | ICD-10-CM

## 2024-05-07 DIAGNOSIS — J320 Chronic maxillary sinusitis: Secondary | ICD-10-CM

## 2024-05-07 NOTE — Progress Notes (Signed)
 Patient ID: [name], female   DOB: 24-Jun-1965, 58 y.o.   MRN: [id number]  Procedure: Bilateral nasal/sinus debridement status post endoscopic sinus surgery.  Indication: The patient previously underwent bilateral endoscopic sinus surgery to treat her chronic maxillary sinusitis and polyposis.  The patient returns today complaining of significant nasal congestion and crusting.  She had a moderate amount of bleeding from the nasal cavities.  Currently the patient denies any facial pain, fever, or visual change.  Anesthesia: Topical Xylocaine  and Neo-Synephrine.  Description: The patient is placed upright in the exam chair. Both nasal cavities are sprayed with topical Xylocaine  and Neo-Synephrine.  A 0 rigid endoscope is used for the examination. The scope is first advanced past the right nostril into the right nasal cavity. A significant amount of crusting and blood clots are noted within the right nasal cavity and  the maxillary cavity.  The crusting and blood clots are removed with suction catheters and alligator forceps, which are inserted in parallel with the rigid endoscope.  After the debridement procedure, the maxillary antrum is noted to be patent.    The same procedure is then repeated on the left side without exception. Similar findings are again noted on the left. The patient tolerated the procedure well.  Follow up care: The patient is instructed to perform daily nasal saline irrigation.  The patient will return for re-evaluation in approximately 2 weeks.

## 2024-05-07 NOTE — Telephone Encounter (Signed)
 05/07/24 Patient called and wanted sooner appt. Patient states she's been constantly bleeding since Thursday and wanted to know was it normal. Also wants to know what to expect during appt. Let patient know I will send message to provider and MA to give call back.

## 2024-05-21 DIAGNOSIS — M0609 Rheumatoid arthritis without rheumatoid factor, multiple sites: Secondary | ICD-10-CM | POA: Diagnosis not present

## 2024-05-21 DIAGNOSIS — Z79899 Other long term (current) drug therapy: Secondary | ICD-10-CM | POA: Diagnosis not present

## 2024-05-21 DIAGNOSIS — I10 Essential (primary) hypertension: Secondary | ICD-10-CM | POA: Diagnosis not present

## 2024-05-21 DIAGNOSIS — E7800 Pure hypercholesterolemia, unspecified: Secondary | ICD-10-CM | POA: Diagnosis not present

## 2024-05-21 DIAGNOSIS — Z0000 Encounter for general adult medical examination without abnormal findings: Secondary | ICD-10-CM | POA: Diagnosis not present

## 2024-05-22 ENCOUNTER — Encounter (INDEPENDENT_AMBULATORY_CARE_PROVIDER_SITE_OTHER): Payer: Self-pay | Admitting: Otolaryngology

## 2024-05-22 ENCOUNTER — Ambulatory Visit (INDEPENDENT_AMBULATORY_CARE_PROVIDER_SITE_OTHER): Admitting: Otolaryngology

## 2024-05-22 VITALS — BP 149/86 | HR 96 | Temp 97.6°F | Ht 60.0 in | Wt 170.0 lb

## 2024-05-22 DIAGNOSIS — J338 Other polyp of sinus: Secondary | ICD-10-CM

## 2024-05-22 DIAGNOSIS — J320 Chronic maxillary sinusitis: Secondary | ICD-10-CM

## 2024-05-22 NOTE — Progress Notes (Signed)
 Patient ID: [name], female   DOB: August 23, 1965, 58 y.o.   MRN: [id number]  Procedure: Bilateral nasal/sinus debridement status post endoscopic sinus surgery.  Indication: The patient previously underwent bilateral endoscopic sinus surgery to treat her chronic maxillary sinusitis and polyposis.  The patient returns today reporting improvement in her nasal congestion and crusting.  She had no significant bleeding from the nasal cavities.  Currently the patient denies any facial pain, fever, or visual change.  Anesthesia: Topical Xylocaine  and Neo-Synephrine.  Description: The patient is placed upright in the exam chair. Both nasal cavities are sprayed with topical Xylocaine  and Neo-Synephrine.  A 0 rigid endoscope is used for the examination. The scope is first advanced past the right nostril into the right nasal cavity. A moderate amount of crusting and blood clots are noted within the right nasal cavity and  the maxillary cavity.  The crusting is removed with suction catheters and alligator forceps, which are inserted in parallel with the rigid endoscope.  After the debridement procedure, the maxillary antrum is noted to be patent.    The same procedure is then repeated on the left side without exception. Similar findings are again noted on the left. The patient tolerated the procedure well.  Follow up care: The patient is instructed to continue daily nasal saline irrigation.  The patient will return for re-evaluation in approximately 4 weeks.

## 2024-06-11 ENCOUNTER — Other Ambulatory Visit (HOSPITAL_COMMUNITY): Payer: Self-pay

## 2024-06-20 ENCOUNTER — Ambulatory Visit (INDEPENDENT_AMBULATORY_CARE_PROVIDER_SITE_OTHER): Admitting: Otolaryngology

## 2024-06-23 ENCOUNTER — Other Ambulatory Visit (HOSPITAL_COMMUNITY): Payer: Self-pay

## 2024-07-09 ENCOUNTER — Other Ambulatory Visit (HOSPITAL_COMMUNITY): Payer: Self-pay

## 2024-07-16 ENCOUNTER — Ambulatory Visit (INDEPENDENT_AMBULATORY_CARE_PROVIDER_SITE_OTHER): Admitting: Otolaryngology

## 2024-07-17 ENCOUNTER — Encounter (INDEPENDENT_AMBULATORY_CARE_PROVIDER_SITE_OTHER): Payer: Self-pay | Admitting: Otolaryngology

## 2024-07-17 ENCOUNTER — Other Ambulatory Visit (HOSPITAL_COMMUNITY): Payer: Self-pay

## 2024-07-17 ENCOUNTER — Ambulatory Visit (INDEPENDENT_AMBULATORY_CARE_PROVIDER_SITE_OTHER): Admitting: Otolaryngology

## 2024-07-17 VITALS — BP 123/80 | HR 79

## 2024-07-17 DIAGNOSIS — J338 Other polyp of sinus: Secondary | ICD-10-CM

## 2024-07-17 DIAGNOSIS — J320 Chronic maxillary sinusitis: Secondary | ICD-10-CM | POA: Diagnosis not present

## 2024-07-17 NOTE — Progress Notes (Signed)
 Patient ID: [name], female   DOB: Jul 30, 1965, 59 y.o.   MRN: [id number]  Procedure: Bilateral nasal/sinus debridement status post endoscopic sinus surgery.  Indication: The patient previously underwent bilateral endoscopic sinus surgery to treat her chronic maxillary sinusitis and polyposis in November 2025.  The patient returns today reporting continuing improvement in her nasal congestion and crusting.  She had no significant bleeding from the nasal cavities.  Currently the patient denies any facial pain, fever, or visual change.  Anesthesia: Topical Xylocaine  and Neo-Synephrine.  Description: The patient is placed upright in the exam chair. Both nasal cavities are sprayed with topical Xylocaine  and Neo-Synephrine.  A 0 rigid endoscope is used for the examination. The scope is first advanced past the right nostril into the right nasal cavity. A small amount of crusting is noted within the right nasal cavity and the maxillary cavity.  The crusting is removed with suction catheters and alligator forceps, which are inserted in parallel with the rigid endoscope.  After the debridement procedure, the maxillary antrum is noted to be patent. The same procedure is then repeated on the left side without exception. Similar findings are again noted on the left. The patient tolerated the procedure well.  Follow up care: The patient is instructed to continue daily nasal saline irrigation.  Restart Flonase  nasal spray 2 sprays each nostril daily.  The patient will return for re-evaluation in 6 months.

## 2024-07-18 ENCOUNTER — Other Ambulatory Visit (HOSPITAL_COMMUNITY): Payer: Self-pay

## 2024-07-19 ENCOUNTER — Encounter: Payer: Self-pay | Admitting: Gastroenterology

## 2025-01-15 ENCOUNTER — Ambulatory Visit (INDEPENDENT_AMBULATORY_CARE_PROVIDER_SITE_OTHER): Admitting: Otolaryngology
